# Patient Record
Sex: Female | Born: 1940 | Race: White | Hispanic: No | Marital: Married
Health system: Southern US, Community
[De-identification: ages and names within clinical notes are randomized; demographics above are authoritative.]

## PROBLEM LIST (undated history)

## (undated) DIAGNOSIS — I10 Essential (primary) hypertension: Secondary | ICD-10-CM

---

## 1997-11-11 ENCOUNTER — Other Ambulatory Visit: Admission: RE | Admit: 1997-11-11 | Discharge: 1997-11-11 | Payer: Self-pay | Admitting: Gynecology

## 1998-11-23 ENCOUNTER — Other Ambulatory Visit: Admission: RE | Admit: 1998-11-23 | Discharge: 1998-11-23 | Payer: Self-pay | Admitting: Urology

## 1998-11-25 ENCOUNTER — Encounter: Payer: Self-pay | Admitting: Urology

## 1998-11-25 ENCOUNTER — Encounter: Admission: RE | Admit: 1998-11-25 | Discharge: 1998-11-25 | Payer: Self-pay | Admitting: Urology

## 1998-12-08 ENCOUNTER — Other Ambulatory Visit: Admission: RE | Admit: 1998-12-08 | Discharge: 1998-12-08 | Payer: Self-pay | Admitting: Urology

## 2001-11-25 ENCOUNTER — Other Ambulatory Visit: Admission: RE | Admit: 2001-11-25 | Discharge: 2001-11-25 | Payer: Self-pay | Admitting: Gynecology

## 2003-02-18 ENCOUNTER — Other Ambulatory Visit: Admission: RE | Admit: 2003-02-18 | Discharge: 2003-02-18 | Payer: Self-pay | Admitting: Gynecology

## 2004-03-23 ENCOUNTER — Other Ambulatory Visit: Admission: RE | Admit: 2004-03-23 | Discharge: 2004-03-23 | Payer: Self-pay | Admitting: Family Medicine

## 2004-03-23 ENCOUNTER — Ambulatory Visit: Payer: Self-pay | Admitting: Family Medicine

## 2004-04-04 ENCOUNTER — Ambulatory Visit: Payer: Self-pay | Admitting: Family Medicine

## 2010-10-02 ENCOUNTER — Encounter: Payer: Self-pay | Admitting: Family Medicine

## 2010-10-02 DIAGNOSIS — Z0289 Encounter for other administrative examinations: Secondary | ICD-10-CM

## 2010-12-22 ENCOUNTER — Emergency Department (HOSPITAL_COMMUNITY)
Admission: EM | Admit: 2010-12-22 | Discharge: 2010-12-22 | Disposition: A | Discharge status: Home / Self Care | Payer: Medicare Other | Attending: Emergency Medicine | Admitting: Emergency Medicine

## 2010-12-22 ENCOUNTER — Encounter: Payer: Self-pay | Admitting: *Deleted

## 2010-12-22 ENCOUNTER — Emergency Department (HOSPITAL_COMMUNITY): Payer: Medicare Other

## 2010-12-22 DIAGNOSIS — N390 Urinary tract infection, site not specified: Secondary | ICD-10-CM | POA: Insufficient documentation

## 2010-12-22 DIAGNOSIS — R509 Fever, unspecified: Secondary | ICD-10-CM | POA: Insufficient documentation

## 2010-12-22 DIAGNOSIS — R062 Wheezing: Secondary | ICD-10-CM | POA: Insufficient documentation

## 2010-12-22 LAB — COMPREHENSIVE METABOLIC PANEL
ALT: 44 U/L — ABNORMAL HIGH (ref 0–35)
AST: 53 U/L — ABNORMAL HIGH (ref 0–37)
Albumin: 2.7 g/dL — ABNORMAL LOW (ref 3.5–5.2)
Alkaline Phosphatase: 72 U/L (ref 39–117)
BUN: 12 mg/dL (ref 6–23)
CO2: 24 mEq/L (ref 19–32)
Calcium: 8.4 mg/dL (ref 8.4–10.5)
Chloride: 98 mEq/L (ref 96–112)
Creatinine, Ser: 0.73 mg/dL (ref 0.50–1.10)
GFR calc Af Amer: >90 mL/min (ref >90)
GFR calc non Af Amer: 83 mL/min — ABNORMAL LOW (ref >90)
Glucose, Bld: 106 mg/dL — ABNORMAL HIGH (ref 70–99)
Potassium: 3.5 mEq/L (ref 3.5–5.1)
Sodium: 131 mEq/L — ABNORMAL LOW (ref 135–145)
Total Bilirubin: 0.3 mg/dL (ref 0.3–1.2)
Total Protein: 6.4 g/dL (ref 6.0–8.3)

## 2010-12-22 LAB — DIFFERENTIAL
Basophils Absolute: 0 10*3/uL (ref 0.0–0.1)
Basophils Relative: 0 % (ref 0–1)
Eosinophils Absolute: 0 10*3/uL (ref 0.0–0.7)
Eosinophils Relative: 0 % (ref 0–5)
Lymphocytes Relative: 12 % (ref 12–46)
Lymphs Abs: 0.8 10*3/uL (ref 0.7–4.0)
Monocytes Absolute: 0.7 10*3/uL (ref 0.1–1.0)
Monocytes Relative: 10 % (ref 3–12)
Neutro Abs: 5.1 10*3/uL (ref 1.7–7.7)
Neutrophils Relative %: 77 % (ref 43–77)

## 2010-12-22 LAB — URINALYSIS, ROUTINE W REFLEX MICROSCOPIC
Bilirubin Urine: NEGATIVE
Glucose, UA: NEGATIVE mg/dL
Ketones, ur: NEGATIVE mg/dL
Nitrite: POSITIVE — AB
Protein, ur: NEGATIVE mg/dL
Specific Gravity, Urine: 1.009 (ref 1.005–1.030)
Urobilinogen, UA: 0.2 mg/dL (ref 0.0–1.0)
pH: 6 (ref 5.0–8.0)

## 2010-12-22 LAB — URINE MICROSCOPIC-ADD ON

## 2010-12-22 LAB — CBC
HCT: 34.2 % — ABNORMAL LOW (ref 36.0–46.0)
Hemoglobin: 11.4 g/dL — ABNORMAL LOW (ref 12.0–15.0)
MCH: 29.2 pg (ref 26.0–34.0)
MCHC: 33.3 g/dL (ref 30.0–36.0)
MCV: 87.7 fL (ref 78.0–100.0)
Platelets: 195 10*3/uL (ref 150–400)
RBC: 3.9 MIL/uL (ref 3.87–5.11)
RDW: 13.1 % (ref 11.5–15.5)
WBC: 6.7 10*3/uL (ref 4.0–10.5)

## 2010-12-22 MED ORDER — IBUPROFEN 200 MG PO TABS
400.0000 mg | ORAL_TABLET | Freq: Once | ORAL | Status: AC
  Administered 2010-12-22: 400 mg via ORAL
  Filled 2010-12-22: qty 2

## 2010-12-22 MED ORDER — AMOXICILLIN 500 MG PO CAPS: 500.0000 mg | ORAL_CAPSULE | Freq: Three times a day (TID) | ORAL | Status: AC

## 2010-12-22 MED ORDER — AMOXICILLIN 500 MG PO CAPS
500.0000 mg | ORAL_CAPSULE | Freq: Once | ORAL | Status: AC
  Administered 2010-12-22: 500 mg via ORAL
  Filled 2010-12-22: qty 1

## 2010-12-22 NOTE — ED Provider Notes (Signed)
History     CSN: [id number] Arrival date & time: 12/22/2010  7:02 PM   First MD Initiated Contact with Patient 12/22/10 1930      Chief Complaint  Patient presents with  . Fever    (Consider location/radiation/quality/duration/timing/severity/associated sxs/prior treatment) HPI 70 year old female presents to emergency department complaining of 5 days of intermittent fever to 104.7, body aches and chills. She denies cough, abdominal pain, nausea, vomiting, diarrhea, no urinary symptoms. Patient denies any recent travel or unusual foods or other sources of infection. No rashes noted Lucila Maine has recently been diagnosed with the flu. History reviewed. No pertinent past medical history.  History reviewed. No pertinent past surgical history.  No family history on file.  History  Substance Use Topics  . Smoking status: Never Smoker   . Smokeless tobacco: Not on file  . Alcohol Use: No    OB History    Grav Para Term Preterm Abortions TAB SAB Ect Mult Living                  Review of Systems  All other systems reviewed and are negative.    Allergies  Sulfa antibiotics; Tylenol; and Erythromycin  Home Medications   Current Outpatient Rx  Name Route Sig Dispense Refill  . ASPIRIN 325 MG PO TABS Oral Take 325 mg by mouth daily.      Marland Kitchen CALCIUM CARBONATE 600 MG PO TABS Oral Take 600 mg by mouth 2 (two) times daily with a meal.      . GINKGO BILOBA 100 MG PO CAPS Oral Take 1 capsule by mouth daily.      Marland Kitchen GLUCOSAMINE-CHONDROITIN 500-400 MG PO TABS Oral Take 1 tablet by mouth 2 (two) times daily.      Marland Kitchen GOLDEN SEAL PO Oral Take 1 tablet by mouth daily.      Marland Kitchen ECHINACEA ACZ PO Oral Take 1 tablet by mouth daily.      . OCUVITE-LUTEIN PO CAPS Oral Take 1 capsule by mouth daily.      Marland Kitchen VITAMIN C 500 MG PO TABS Oral Take 500 mg by mouth daily.      . AMOXICILLIN 500 MG PO CAPS Oral Take 1 capsule (500 mg total) by mouth 3 (three) times daily. 21 capsule 0    BP 93/41  Pulse  73  Temp(Src) 98.1 F (36.7 C) (Oral)  Resp 18  Wt 120 lb (54.432 kg)  SpO2 97%  Physical Exam  Nursing note and vitals reviewed. Constitutional: She is oriented to person, place, and time. She appears well-developed and well-nourished.  HENT:  Head: Normocephalic and atraumatic.  Nose: Nose normal.  Mouth/Throat: Oropharynx is clear and moist.  Eyes: Conjunctivae and EOM are normal. Pupils are equal, round, and reactive to light.  Neck: Normal range of motion. Neck supple. No JVD present. No tracheal deviation present. No thyromegaly present.  Cardiovascular: Normal rate, regular rhythm, normal heart sounds and intact distal pulses.  Exam reveals no gallop and no friction rub.   No murmur heard. Pulmonary/Chest: Effort normal and breath sounds normal. No stridor. No respiratory distress. She has no wheezes. She has no rales. She exhibits no tenderness.  Abdominal: Soft. Bowel sounds are normal. She exhibits no distension and no mass. There is no tenderness. There is no rebound and no guarding.  Musculoskeletal: Normal range of motion. She exhibits no edema and no tenderness.  Lymphadenopathy:    She has no cervical adenopathy.  Neurological: She is oriented to person, place, and  time. She exhibits normal muscle tone. Coordination normal.  Skin: Skin is dry. No rash noted. No erythema. No pallor.  Psychiatric: She has a normal mood and affect. Her behavior is normal. Judgment and thought content normal.    ED Course  Procedures (including critical care time)  Labs Reviewed  URINALYSIS, ROUTINE W REFLEX MICROSCOPIC - Abnormal; Notable for the following:    APPearance CLOUDY (*)    Hgb urine dipstick LARGE (*)    Nitrite POSITIVE (*)    Leukocytes, UA MODERATE (*)    All other components within normal limits  CBC - Abnormal; Notable for the following:    Hemoglobin 11.4 (*)    HCT 34.2 (*)    All other components within normal limits  COMPREHENSIVE METABOLIC PANEL - Abnormal;  Notable for the following:    Sodium 131 (*)    Glucose, Bld 106 (*)    Albumin 2.7 (*)    AST 53 (*)    ALT 44 (*)    GFR calc non Af Amer 83 (*)    All other components within normal limits  URINE MICROSCOPIC-ADD ON - Abnormal; Notable for the following:    Bacteria, UA FEW (*)    All other components within normal limits  DIFFERENTIAL  URINE CULTURE  CULTURE, BLOOD (ROUTINE X 2)  CULTURE, BLOOD (ROUTINE X 2)   Dg Chest 2 View  12/22/2010  *RADIOLOGY REPORT*  Clinical Data: Fever and wheezing  CHEST - 2 VIEW  Comparison: None.  Findings: Heart size is normal.  Negative for heart failure.  Lungs are clear without infiltrate or effusion.  Mild atelectasis or scarring in the lingula or right middle lobe on the lateral view.  IMPRESSION: No active cardiopulmonary disease.  Original Report Authenticated By: Camelia Phenes, M.D.     1. Fever   2. Urinary tract infection       MDM  70 year old female with 5 days of fever possible urinary tract infection as source of infection, although viral syndrome and/or influenza is still in the differential. Patient is well outside the window for Tamiflu. Will treat with amoxicillin for urinary tract infection given patient's multiple allergies and refusal to try any new antibiotics. Urine sent for culture        Olivia Mackie, MD 12/22/10 2200

## 2010-12-22 NOTE — ED Notes (Signed)
[id number]<AV> Expected date:12/22/10<BR> Expected time: 6:43 PM<BR> Means of arrival:Ambulance<BR> Comments:<BR> EMS 261 GC, 70 yof fever

## 2010-12-22 NOTE — ED Notes (Signed)
Pt c/o only of fever x 5 days. Pt has been infrequently taking medication to control fever. Pt states she has been maintaining good hydration and using ice packs to help reduce fever. Pt states she takes a lot of herbal remedies that she does not have a list of nor does she know exactly what herbs are contained within many of them. Pt is in no acute distress at this time. Denies n/v/d, denies pain.

## 2010-12-22 NOTE — ED Notes (Signed)
Pt from home, reports fever, highest 104.5. Denies cough, n/v, any other symptoms. Pt is HOH.

## 2010-12-25 LAB — URINE CULTURE
Colony Count: >=100000
Culture  Setup Time: 201212150156
Special Requests: NORMAL

## 2010-12-26 NOTE — ED Notes (Signed)
+   Urine Patient treated with Amoxicillin-sensitive to same-chart appended per protocol MD. 

## 2010-12-29 LAB — CULTURE, BLOOD (ROUTINE X 2)
Culture  Setup Time: 201212150250
Culture  Setup Time: 201212150250
Culture: NO GROWTH
Culture: NO GROWTH

## 2016-04-10 ENCOUNTER — Observation Stay (HOSPITAL_COMMUNITY)
Admission: EM | Admit: 2016-04-10 | Discharge: 2016-04-11 | Disposition: A | Discharge status: Home / Self Care | Payer: Medicare Other | Attending: Internal Medicine | Admitting: Internal Medicine

## 2016-04-10 ENCOUNTER — Encounter (HOSPITAL_COMMUNITY): Payer: Self-pay

## 2016-04-10 ENCOUNTER — Emergency Department (HOSPITAL_COMMUNITY): Payer: Medicare Other

## 2016-04-10 ENCOUNTER — Observation Stay (HOSPITAL_COMMUNITY): Payer: Medicare Other

## 2016-04-10 DIAGNOSIS — I160 Hypertensive urgency: Secondary | ICD-10-CM | POA: Diagnosis not present

## 2016-04-10 DIAGNOSIS — I6523 Occlusion and stenosis of bilateral carotid arteries: Secondary | ICD-10-CM | POA: Insufficient documentation

## 2016-04-10 DIAGNOSIS — I071 Rheumatic tricuspid insufficiency: Secondary | ICD-10-CM | POA: Insufficient documentation

## 2016-04-10 DIAGNOSIS — I161 Hypertensive emergency: Secondary | ICD-10-CM | POA: Diagnosis present

## 2016-04-10 DIAGNOSIS — G459 Transient cerebral ischemic attack, unspecified: Secondary | ICD-10-CM | POA: Diagnosis present

## 2016-04-10 DIAGNOSIS — R200 Anesthesia of skin: Secondary | ICD-10-CM | POA: Diagnosis not present

## 2016-04-10 DIAGNOSIS — G458 Other transient cerebral ischemic attacks and related syndromes: Secondary | ICD-10-CM | POA: Diagnosis not present

## 2016-04-10 DIAGNOSIS — Z79899 Other long term (current) drug therapy: Secondary | ICD-10-CM | POA: Insufficient documentation

## 2016-04-10 DIAGNOSIS — Z7982 Long term (current) use of aspirin: Secondary | ICD-10-CM | POA: Insufficient documentation

## 2016-04-10 HISTORY — DX: Anesthesia of skin: R20.0

## 2016-04-10 LAB — BASIC METABOLIC PANEL
Anion gap: 11 (ref 5–15)
BUN: 16 mg/dL (ref 6–20)
CO2: 25 mmol/L (ref 22–32)
Calcium: 9.9 mg/dL (ref 8.9–10.3)
Chloride: 101 mmol/L (ref 101–111)
Creatinine, Ser: 0.68 mg/dL (ref 0.44–1.00)
GFR calc Af Amer: >60 mL/min (ref >60)
GFR calc non Af Amer: >60 mL/min (ref >60)
Glucose, Bld: 98 mg/dL (ref 65–99)
Potassium: 4 mmol/L (ref 3.5–5.1)
Sodium: 137 mmol/L (ref 135–145)

## 2016-04-10 LAB — CBC WITH DIFFERENTIAL/PLATELET
Basophils Absolute: 0 10*3/uL (ref 0.0–0.1)
Basophils Relative: 0 %
Eosinophils Absolute: 0.1 10*3/uL (ref 0.0–0.7)
Eosinophils Relative: 1 %
HCT: 43.3 % (ref 36.0–46.0)
Hemoglobin: 14.2 g/dL (ref 12.0–15.0)
Lymphocytes Relative: 38 %
Lymphs Abs: 2.4 10*3/uL (ref 0.7–4.0)
MCH: 29.8 pg (ref 26.0–34.0)
MCHC: 32.8 g/dL (ref 30.0–36.0)
MCV: 90.8 fL (ref 78.0–100.0)
Monocytes Absolute: 0.5 10*3/uL (ref 0.1–1.0)
Monocytes Relative: 8 %
Neutro Abs: 3.2 10*3/uL (ref 1.7–7.7)
Neutrophils Relative %: 53 %
Platelets: 232 10*3/uL (ref 150–400)
RBC: 4.77 MIL/uL (ref 3.87–5.11)
RDW: 13.4 % (ref 11.5–15.5)
WBC: 6.2 10*3/uL (ref 4.0–10.5)

## 2016-04-10 LAB — I-STAT TROPONIN, ED: Troponin i, poc: 0.05 ng/mL (ref 0.00–0.08)

## 2016-04-10 LAB — TSH: TSH: 3.122 u[IU]/mL (ref 0.350–4.500)

## 2016-04-10 LAB — MAGNESIUM: Magnesium: 2 mg/dL (ref 1.7–2.4)

## 2016-04-10 MED ORDER — LORAZEPAM 2 MG/ML IJ SOLN
0.5000 mg | Freq: Once | INTRAMUSCULAR | Status: AC
  Administered 2016-04-10: 0.5 mg via INTRAVENOUS
  Filled 2016-04-10: qty 1

## 2016-04-10 MED ORDER — ASPIRIN 325 MG PO TABS
325.0000 mg | ORAL_TABLET | Freq: Every day | ORAL | Status: DC
  Administered 2016-04-11: 325 mg via ORAL
  Filled 2016-04-10: qty 1

## 2016-04-10 MED ORDER — SENNOSIDES-DOCUSATE SODIUM 8.6-50 MG PO TABS: 1.0000 | ORAL_TABLET | Freq: Every evening | ORAL | Status: DC | PRN

## 2016-04-10 MED ORDER — ENOXAPARIN SODIUM 40 MG/0.4ML ~~LOC~~ SOLN
40.0000 mg | SUBCUTANEOUS | Status: DC
  Administered 2016-04-10: 40 mg via SUBCUTANEOUS
  Filled 2016-04-10: qty 0.4

## 2016-04-10 MED ORDER — STROKE: EARLY STAGES OF RECOVERY BOOK
Freq: Once | Status: AC
  Administered 2016-04-10: 23:00:00
  Filled 2016-04-10: qty 1

## 2016-04-10 MED ORDER — LABETALOL HCL 5 MG/ML IV SOLN: 5.0000 mg | INTRAVENOUS | Status: DC | PRN

## 2016-04-10 MED ORDER — ASPIRIN 81 MG PO CHEW
324.0000 mg | CHEWABLE_TABLET | Freq: Once | ORAL | Status: AC
  Administered 2016-04-10: 324 mg via ORAL
  Filled 2016-04-10: qty 4

## 2016-04-10 MED ORDER — SODIUM CHLORIDE 0.9 % IV SOLN
INTRAVENOUS | Status: DC
  Administered 2016-04-10: 23:00:00 via INTRAVENOUS

## 2016-04-10 MED ORDER — ASPIRIN 300 MG RE SUPP: 300.0000 mg | Freq: Every day | RECTAL | Status: DC

## 2016-04-10 NOTE — ED Notes (Signed)
Called floor 3x for report. No answer.

## 2016-04-10 NOTE — ED Notes (Signed)
Pt taken to CT by Reuel Boom, Transporter

## 2016-04-10 NOTE — ED Triage Notes (Signed)
Patient complains of intermittent numbness in right arm today that only lasted a few minutes and has resolved. 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 On arrival no neuro deficits, no pain, alert and oriented. Took 2 baby asa prior to arrival

## 2016-04-10 NOTE — H&P (Signed)
History and Physical    Rebecca Hurley JXB:[id number] DOB: April 11, 1940 DOA: 04/10/2016  PCP: Default, Provider, MD   Patient coming from: Home  Chief Complaint: Right-sided numbness   HPI: Rebecca Hurley is a 76 y.o. female who denies any significant past medical history, now presenting to the emergency department for numbness involving the right arm, right leg, and right lower face. 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 She had otherwise been in her usual state of health with no recent fevers or chills, no chest pain or palpitations, and no recent headache, change in vision or hearing, or focal weakness. She describes the insidious development of numbness involving the right arm earlier today, then progressing to involve the right leg as well. These symptoms seem to wax and wane over the course of the day and she later developed numbness into her right neck and right lower face. By this time, the patient was becoming quite concerned with the symptoms and sought evaluation in the ED. She took 2 baby aspirins prior to coming in and reports that the symptoms seemed to resolve following that. She had never experienced similar symptoms previously. There's been no recent fall or trauma.  ED Course: Upon arrival to the ED, patient is found to be afebrile, saturating well on room air, hypertensive to 199/89, and with vitals otherwise stable. EKG features a normal sinus rhythm and noncontrast head CT is negative for hemorrhage, but notable for small hypodensity in the left frontal region which may represent chronic microvascular changes, but could also possibly represent a small acute infarct. Chemistry panel is unremarkable, CBC is within the normal limits, TSH is normal, and troponin is also in the normal range. Patient was treated with 3 and 24 mg of aspirin at 0.5 mg IV Ativan. Neuro was consulted by the ED  physician and advised treating the patient as if the CT finding does represent a small acute infarct in until it can be further characterized. She has remained hypertensive, but otherwise stable in the ED and will be observed on telemetry unit for ongoing evaluation and management of transient right-sided numbness with hypertensive urgency and noncontrast head CT finding of uncertain significance.  Review of Systems:  All other systems reviewed and apart from HPI, are negative.  History reviewed. No pertinent past medical history.  History reviewed. No pertinent surgical history.   reports that she has never smoked. She does not have any smokeless tobacco history on file. She reports that she does not drink alcohol. Her drug history is not on file.  Allergies  Allergen Reactions  . Sulfa Antibiotics Other (See Comments)    Severe reaction-per family  . Erythromycin Other (See Comments)    Potential ear troubles/ deafness  . Tylenol [Acetaminophen] Other (See Comments)    Altered mental status and mental changes    History reviewed. No pertinent family history.   Prior to Admission medications   Medication Sig Start Date End Date Taking? Authorizing Provider  aspirin 325 MG tablet Take 650 mg by mouth every 6 (six) hours as needed for moderate pain.    Yes Historical Provider, MD  CALCIUM-VITAMIN D PO Take 1 tablet by mouth 2 (two) times daily.   Yes Historical Provider, MD  CRANBERRY JUICE EXTRACT PO Take 1 tablet by mouth 3 (three) times a week.   Yes Historical Provider, MD  Flaxseed, Linseed, (FLAX SEED OIL PO) Take 5  mLs by mouth daily.   Yes Historical Provider, MD  Ginkgo Biloba (GNP GINGKO BILOBA EXTRACT PO) Take 40 mg by mouth daily.   Yes Historical Provider, MD  glucosamine-chondroitin 500-400 MG tablet Take 1 tablet by mouth 2 (two) times daily.     Yes Historical Provider, MD  IODINE, KELP, PO Take 1 tablet by mouth daily.   Yes Historical Provider, MD  LUTEIN PO Take 120  mg by mouth daily.   Yes Historical Provider, MD  vitamin C (ASCORBIC ACID) 500 MG tablet Take 500 mg by mouth 2 (two) times daily.    Yes Historical Provider, MD    Physical Exam: Vitals:   04/10/16 1900 04/10/16 1930 04/10/16 2000 04/10/16 2030  BP: (!) 184/72 (!) 187/76 (!) 182/98 (!) 174/80  Pulse: 67 64 66 62  Resp: (!) 22  Temp:      TempSrc:      SpO2: 100% 100% 96% 98%      Constitutional: NAD, calm, comfortable Eyes: PERTLA, lids and conjunctivae normal ENMT: Mucous membranes are moist. Posterior pharynx clear of any exudate or lesions.   Neck: normal, supple, no masses, no thyromegaly Respiratory: clear to auscultation bilaterally, no wheezing, no crackles. Normal respiratory effort.    Cardiovascular: S1 & S2 heard, regular rate and rhythm. No extremity edema. No significant JVD. Abdomen: No distension, no tenderness, no masses palpated. Bowel sounds normal.  Musculoskeletal: no clubbing / cyanosis. No joint deformity upper and lower extremities.    Skin: no significant rashes, lesions, ulcers. Warm, dry, well-perfused. Neurologic: CN 2-12 grossly intact. Sensation intact, DTR normal. Strength 5/5 in all 4 limbs.  Psychiatric: Alert and oriented x 3. Normal mood and affect.     Labs on Admission: I have personally reviewed following labs and imaging studies  CBC:  Recent Labs Lab 04/10/16 1258  WBC 6.2  NEUTROABS 3.2  HGB 14.2  HCT 43.3  MCV 90.8  PLT 232   Basic Metabolic Panel:  Recent Labs Lab 04/10/16 1258 04/10/16 1701  NA 137  --   K 4.0  --   CL 101  --   CO2 25  --   GLUCOSE 98  --   BUN 16  --   CREATININE 0.68  --   CALCIUM 9.9  --   MG  --  2.0   GFR: CrCl cannot be calculated (Unknown ideal weight.). Liver Function Tests: No results for input(s): AST, ALT, ALKPHOS, BILITOT, PROT, ALBUMIN in the last 168 hours. No results for input(s): LIPASE, AMYLASE in the last 168 hours. No results for input(s): AMMONIA in the last  168 hours. Coagulation Profile: No results for input(s): INR, PROTIME in the last 168 hours. Cardiac Enzymes: No results for input(s): CKTOTAL, CKMB, CKMBINDEX, TROPONINI in the last 168 hours. BNP (last 3 results) No results for input(s): PROBNP in the last 8760 hours. HbA1C: No results for input(s): HGBA1C in the last 72 hours. CBG: No results for input(s): GLUCAP in the last 168 hours. Lipid Profile: No results for input(s): CHOL, HDL, LDLCALC, TRIG, CHOLHDL, LDLDIRECT in the last 72 hours. Thyroid Function Tests:  Recent Labs  04/10/16 1701  TSH 3.122   Anemia Panel: No results for input(s): VITAMINB12, FOLATE, FERRITIN, TIBC, IRON, RETICCTPCT in the last 72 hours. Urine analysis:    Component Value Date/Time   COLORURINE YELLOW 12/22/2010 2018   APPEARANCEUR CLOUDY (A) 12/22/2010 2018   LABSPEC 1.009 12/22/2010 2018   PHURINE 6.0 12/22/2010 2018  GLUCOSEU NEGATIVE 12/22/2010 2018   HGBUR LARGE (A) 12/22/2010 2018   BILIRUBINUR NEGATIVE 12/22/2010 2018   KETONESUR NEGATIVE 12/22/2010 2018   PROTEINUR NEGATIVE 12/22/2010 2018   UROBILINOGEN 0.2 12/22/2010 2018   NITRITE POSITIVE (A) 12/22/2010 2018   LEUKOCYTESUR MODERATE (A) 12/22/2010 2018   Sepsis Labs: (procalcitonin:4,lacticidven:4) )No results found for this or any previous visit (from the past 240 hour(s)).   Radiological Exams on Admission: Ct Head Wo Contrast  Result Date: 04/10/2016 CLINICAL DATA:  76 year old female with right-sided arm and leg numbness. Episode of elevated blood pressure. Initial encounter. EXAM: CT HEAD WITHOUT CONTRAST TECHNIQUE: Contiguous axial images were obtained from the base of the skull through the vertex without intravenous contrast. COMPARISON:  None. FINDINGS: Brain: No intracranial hemorrhage. Small hypodensity left frontal region may represent result of chronic microvascular changes. Small acute infarct difficult to completely exclude. No CT evidence of large acute  infarct. Mild to moderate chronic microvascular changes otherwise noted. Mild atrophy typical of age without hydrocephalus. No intracranial mass lesion noted on this unenhanced exam. Vascular: No hyperdense vessel. Skull: Negative Sinuses/Orbits: No acute orbital abnormality. Visualized paranasal sinuses are clear. Other: Negative. IMPRESSION: No intracranial hemorrhage. Small hypodensity left frontal region may represent result of chronic microvascular changes. Small acute infarct difficult to completely exclude. No CT evidence of large acute infarct. Mild to moderate chronic microvascular changes otherwise noted. Electronically Signed   By: Lacy Duverney M.D.   On: 04/10/2016 17:43    EKG: Independently reviewed. Normal sinus rhythm.   Assessment/Plan  1. Right-sided numbness, resolved  - Pt presents with transient right-sided numbness involving arm, leg, and lower right face; sxs resolved PTA  - Head CT notable for a small hypodensity in left frontal region, possibly representing chronic microvascular change, but with small acute infarct not excluded  - There are no focal neurologic abnormalities identified on admission  - Neurology is consulting and much appreciated  - Monitor on telemetry with frequent neuro checks, PT/OT consults; she has passed swallow eval  - Obtain MRI brain, MRA head, carotid dopplers, echo, fasting lipids, and A1c  - Continue ppx ASA, check LFT's and consider statin   2. Hypertensive urgency  - BP as high as 199/89 in ED; pt asymptomatic  - Denies hx of HTN and not on any medications  - As acute ischemic CVA is being ruled-out currently, will permit HTN to 210/110 pending MRI and treat with labetalol IVP's for pressures exceeding that     DVT prophylaxis: sq Lovenox  Code Status: Full  Family Communication: Friend updated at bedside at patient's request Disposition Plan: Observe on telemetry Consults called: Neurology Admission status: Observation    Briscoe Deutscher, MD [facility] Pager [phone]  If 7PM-7AM, please contact night-coverage [email or web address] Password [email or web address]  04/10/2016, 9:10 PM

## 2016-04-10 NOTE — ED Provider Notes (Signed)
[facility]-EMERGENCY DEPT Provider Note   CSN: [id number] Arrival date & time: 04/10/16  1239     History   Chief Complaint No chief complaint on file.   HPI Rebecca Hurley is a 76 y.o. female.  HPI   76 yo F with no significant PMhx here with hypertension and numbness. Pt states that she has been under increased stress over the last several days, due to stress at her church. Over the last day, she has had multiple episodes of transient right face, arm, and leg numbness. This occurs in "waves" and involves only parts of her arm, leg, or face - not all at the same time. She has a mild HA associated with this and has noticed that her BP has significantly increased. She does not normally take antiHTN. Denies any CP. No other numbness or weakness. No vision changes, dysarthria, or dysphagia. No recent trauma. She does not take blood thinners.   History reviewed. No pertinent past medical history.  Patient Active Problem List   Diagnosis Date Noted  . Hypertensive urgency 04/10/2016  . Numbness on right side 04/10/2016  . TIA (transient ischemic attack) 04/10/2016    History reviewed. No pertinent surgical history.  OB History    No data available       Home Medications    Prior to Admission medications   Medication Sig Start Date End Date Taking? Authorizing Provider  aspirin 325 MG tablet Take 650 mg by mouth every 6 (six) hours as needed for moderate pain.    Yes Historical Provider, MD  CALCIUM-VITAMIN D PO Take 1 tablet by mouth 2 (two) times daily.   Yes Historical Provider, MD  CRANBERRY JUICE EXTRACT PO Take 1 tablet by mouth 3 (three) times a week.   Yes Historical Provider, MD  Flaxseed, Linseed, (FLAX SEED OIL PO) Take 5 mLs by mouth daily.   Yes Historical Provider, MD  Ginkgo Biloba (GNP GINGKO BILOBA EXTRACT PO) Take 40 mg by mouth daily.   Yes Historical Provider, MD  glucosamine-chondroitin 500-400 MG tablet Take 1 tablet by mouth 2 (two) times daily.     Yes  Historical Provider, MD  IODINE, KELP, PO Take 1 tablet by mouth daily.   Yes Historical Provider, MD  LUTEIN PO Take 120 mg by mouth daily.   Yes Historical Provider, MD  vitamin C (ASCORBIC ACID) 500 MG tablet Take 500 mg by mouth 2 (two) times daily.    Yes Historical Provider, MD    Family History History reviewed. No pertinent family history.  Social History Social History  Substance Use Topics  . Smoking status: Never Smoker  . Smokeless tobacco: Never Used  . Alcohol use No     Allergies   Sulfa antibiotics; Erythromycin; and Tylenol [acetaminophen]   Review of Systems Review of Systems  Constitutional: Positive for fatigue. Negative for chills and fever.  HENT: Negative for congestion, rhinorrhea and sore throat.   Eyes: Negative for visual disturbance.  Respiratory: Negative for cough, shortness of breath and wheezing.   Cardiovascular: Negative for chest pain and leg swelling.  Gastrointestinal: Negative for abdominal pain, diarrhea, nausea and vomiting.  Genitourinary: Negative for dysuria, flank pain, vaginal bleeding and vaginal discharge.  Musculoskeletal: Negative for neck pain.  Skin: Negative for rash.  Allergic/Immunologic: Negative for immunocompromised state.  Neurological: Positive for numbness and headaches. Negative for syncope.  Hematological: Does not bruise/bleed easily.  Psychiatric/Behavioral: Positive for sleep disturbance.  All other systems reviewed and are negative.  Physical Exam Updated Vital Signs BP (!) 135/52 (BP Location: Left Arm)   Pulse (!) 58   Temp 98.2 F (36.8 C) (Oral)   Resp 10   Ht  (1.549 m)   Wt 120 lb 5.9 oz (54.6 kg)   SpO2 100%   BMI 22.74 kg/m   Physical Exam  Constitutional: She is oriented to person, place, and time. She appears well-developed and well-nourished. No distress.  HENT:  Head: Normocephalic and atraumatic.  Eyes: Conjunctivae are normal.  Neck: Neck supple.  Cardiovascular: Normal  rate, regular rhythm and normal heart sounds.  Exam reveals no friction rub.   No murmur heard. Pulmonary/Chest: Effort normal and breath sounds normal. No respiratory distress. She has no wheezes. She has no rales.  Abdominal: She exhibits no distension.  Musculoskeletal: She exhibits no edema.  Neurological: She is alert and oriented to person, place, and time. She exhibits normal muscle tone.  Skin: Skin is warm. Capillary refill takes less than 2 seconds.  Psychiatric: She has a normal mood and affect.  Nursing note and vitals reviewed.   Neurological Exam:  Mental Status: Alert and oriented to person, place, and time. Attention and concentration normal. Speech clear. Recent memory is intact. Cranial Nerves: Visual fields grossly intact. EOMI and PERRLA. No nystagmus noted. Facial sensation intact at forehead, maxillary cheek, and chin/mandible bilaterally. No facial asymmetry or weakness. Hearing grossly normal. Uvula is midline, and palate elevates symmetrically. Normal SCM and trapezius strength. Tongue midline without fasciculations. Motor: Muscle strength 5/5 in proximal and distal UE and LE bilaterally. No pronator drift. Muscle tone normal. Reflexes: 2+ and symmetrical in all four extremities.  Sensation: Intact to light touch in upper and lower extremities distally bilaterally.  Gait: Normal without ataxia. Coordination: Normal FTN bilaterally.    ED Treatments / Results  Labs (all labs ordered are listed, but only abnormal results are displayed) Labs Reviewed  CBC WITH DIFFERENTIAL/PLATELET  BASIC METABOLIC PANEL  TSH  MAGNESIUM  HEMOGLOBIN A1C  LIPID PANEL  HEPATIC FUNCTION PANEL  CK  I-STAT TROPOININ, ED    EKG  EKG Interpretation  Date/Time:  Tuesday April 10 2016 17:06:04 EDT Ventricular Rate:  65 PR Interval:    QRS Duration: 109 QT Interval:  415 QTC Calculation: 432 R Axis:   81 Text Interpretation:  Sinus rhythm Borderline right axis deviation  Otherwise normal EKG No old tracing to compare Confirmed by Dally Oshel MD, Shellye Zandi [phone]) on 04/11/2016 1:10:06 AM       Radiology Ct Head Wo Contrast  Result Date: 04/10/2016 CLINICAL DATA:  76 year old female with right-sided arm and leg numbness. Episode of elevated blood pressure. Initial encounter. EXAM: CT HEAD WITHOUT CONTRAST TECHNIQUE: Contiguous axial images were obtained from the base of the skull through the vertex without intravenous contrast. COMPARISON:  None. FINDINGS: Brain: No intracranial hemorrhage. Small hypodensity left frontal region may represent result of chronic microvascular changes. Small acute infarct difficult to completely exclude. No CT evidence of large acute infarct. Mild to moderate chronic microvascular changes otherwise noted. Mild atrophy typical of age without hydrocephalus. No intracranial mass lesion noted on this unenhanced exam. Vascular: No hyperdense vessel. Skull: Negative Sinuses/Orbits: No acute orbital abnormality. Visualized paranasal sinuses are clear. Other: Negative. IMPRESSION: No intracranial hemorrhage. Small hypodensity left frontal region may represent result of chronic microvascular changes. Small acute infarct difficult to completely exclude. No CT evidence of large acute infarct. Mild to moderate chronic microvascular changes otherwise noted. Electronically Signed   By: Viviann Spare  Constance Goltz M.D.   On: 04/10/2016 17:43   Mr Brain Wo Contrast  Result Date: 04/11/2016 CLINICAL DATA:  76 y/o F; numbness of right arm, leg, and right lower face. EXAM: MRI HEAD WITHOUT CONTRAST MRA HEAD WITHOUT CONTRAST TECHNIQUE: Multiplanar, multiecho pulse sequences of the brain and surrounding structures were obtained without intravenous contrast. Angiographic images of the head were obtained using MRA technique without contrast. COMPARISON:  04/10/2016 CT of the head. FINDINGS: MRI HEAD FINDINGS Brain: No acute infarction, hemorrhage, hydrocephalus, extra-axial collection or  mass lesion. Nonspecific foci of T2 FLAIR hyperintense signal abnormality are present in subcortical and periventricular white matter compatible with mild chronic microvascular ischemic changes. FLAIR signal abnormality is inclusive of the lucent lesion seen on CT. Mild brain parenchymal volume loss. Vascular: As below. Skull and upper cervical spine: Normal marrow signal. Sinuses/Orbits: Negative. Other: None. MRA HEAD FINDINGS Internal carotid arteries:  Patent. Anterior cerebral arteries:  Patent. Middle cerebral arteries: Patent. Anterior communicating artery: Patent diminutive vessel. Posterior communicating arteries: Patent right. No left identified, likely hypoplastic or absent. Posterior cerebral arteries:  Patent. Basilar artery:  Patent. Vertebral arteries:  Patent. No evidence of high-grade stenosis, large vessel occlusion, or aneurysm. IMPRESSION: 1. No acute intracranial abnormality. 2. Mild chronic microvascular ischemic changes of white matter inclusive of the hypodense focus seen on CT. 3. Mild brain parenchymal volume loss. 4. Unremarkable MRA of the head. No evidence of high-grade stenosis, large vessel occlusion, or aneurysm. Electronically Signed   By: Mitzi Hansen M.D.   On: 04/11/2016 00:58   Mr Maxine Glenn Head/brain OZ Cm  Result Date: 04/11/2016 CLINICAL DATA:  76 y/o F; numbness of right arm, leg, and right lower face. EXAM: MRI HEAD WITHOUT CONTRAST MRA HEAD WITHOUT CONTRAST TECHNIQUE: Multiplanar, multiecho pulse sequences of the brain and surrounding structures were obtained without intravenous contrast. Angiographic images of the head were obtained using MRA technique without contrast. COMPARISON:  04/10/2016 CT of the head. FINDINGS: MRI HEAD FINDINGS Brain: No acute infarction, hemorrhage, hydrocephalus, extra-axial collection or mass lesion. Nonspecific foci of T2 FLAIR hyperintense signal abnormality are present in subcortical and periventricular white matter compatible with  mild chronic microvascular ischemic changes. FLAIR signal abnormality is inclusive of the lucent lesion seen on CT. Mild brain parenchymal volume loss. Vascular: As below. Skull and upper cervical spine: Normal marrow signal. Sinuses/Orbits: Negative. Other: None. MRA HEAD FINDINGS Internal carotid arteries:  Patent. Anterior cerebral arteries:  Patent. Middle cerebral arteries: Patent. Anterior communicating artery: Patent diminutive vessel. Posterior communicating arteries: Patent right. No left identified, likely hypoplastic or absent. Posterior cerebral arteries:  Patent. Basilar artery:  Patent. Vertebral arteries:  Patent. No evidence of high-grade stenosis, large vessel occlusion, or aneurysm. IMPRESSION: 1. No acute intracranial abnormality. 2. Mild chronic microvascular ischemic changes of white matter inclusive of the hypodense focus seen on CT. 3. Mild brain parenchymal volume loss. 4. Unremarkable MRA of the head. No evidence of high-grade stenosis, large vessel occlusion, or aneurysm. Electronically Signed   By: Mitzi Hansen M.D.   On: 04/11/2016 00:58    Procedures Procedures (including critical care time)  Medications Ordered in ED Medications  0.9 %  sodium chloride infusion ( Intravenous New Bag/Given 04/10/16 2257)  senna-docusate (Senokot-S) tablet 1 tablet (not administered)  enoxaparin (LOVENOX) injection 40 mg (40 mg Subcutaneous Given 04/10/16 2257)  aspirin suppository 300 mg (not administered)    Or  aspirin tablet 325 mg (not administered)  labetalol (NORMODYNE,TRANDATE) injection 5 mg (not administered)  LORazepam (ATIVAN) injection  0.5 mg (0.5 mg Intravenous Given 04/10/16 1713)  aspirin chewable tablet 324 mg (324 mg Oral Given 04/10/16 1835)   stroke: mapping our early stages of recovery book ( Does not apply Given 04/10/16 2254)     Initial Impression / Assessment and Plan / ED Course  I have reviewed the triage vital signs and the nursing notes.  Pertinent  labs & imaging results that were available during my care of the patient were reviewed by me and considered in my medical decision making (see chart for details).    76 yo F with PMhx as above here with transient left sided numbness and increasing BP. On arrival, pt is hypertensive but o/w HDS. She has no ongoing neurological deficits. While I suspect this may be 2/2 her HTN, possible anxiety from recent stressors, must also consider TIA. Her CT head does show a small area of vascular disease/possible old CVA as well. D/w Neuro - pt given ASA. No indication for tPA or intervention at this time and sx resolved. Will admit for TIA/CVA work-up. Will allow permissive HTN at this time.  Final Clinical Impressions(s) / ED Diagnoses   Final diagnoses:  Numbness  Other specified transient cerebral ischemias    New Prescriptions Current Discharge Medication List       Shaune Pollack, MD 04/11/16 0111

## 2016-04-10 NOTE — Progress Notes (Signed)
Pt admitted from ED with stroke like symptoms, alert and oriented, denies any pain, pt settled in bed with husband and call light  at bedside, pt HOH, close caption placed on TV, tele monitor placed and verified on pt, safety concern addressed,was however reassured and will continue to monitor, v/s stable. Rebecca Hurley, [name]

## 2016-04-11 ENCOUNTER — Observation Stay (HOSPITAL_BASED_OUTPATIENT_CLINIC_OR_DEPARTMENT_OTHER): Payer: Medicare Other

## 2016-04-11 ENCOUNTER — Encounter (HOSPITAL_COMMUNITY): Payer: Self-pay | Admitting: *Deleted

## 2016-04-11 DIAGNOSIS — G459 Transient cerebral ischemic attack, unspecified: Secondary | ICD-10-CM | POA: Diagnosis not present

## 2016-04-11 DIAGNOSIS — I160 Hypertensive urgency: Secondary | ICD-10-CM | POA: Diagnosis not present

## 2016-04-11 DIAGNOSIS — R200 Anesthesia of skin: Secondary | ICD-10-CM

## 2016-04-11 DIAGNOSIS — G458 Other transient cerebral ischemic attacks and related syndromes: Secondary | ICD-10-CM | POA: Diagnosis not present

## 2016-04-11 LAB — HEPATIC FUNCTION PANEL
ALT: 11 U/L — ABNORMAL LOW (ref 14–54)
AST: 23 U/L (ref 15–41)
Albumin: 3.1 g/dL — ABNORMAL LOW (ref 3.5–5.0)
Alkaline Phosphatase: 51 U/L (ref 38–126)
Bilirubin, Direct: 0.1 mg/dL (ref 0.1–0.5)
Indirect Bilirubin: 0.7 mg/dL (ref 0.3–0.9)
Total Bilirubin: 0.8 mg/dL (ref 0.3–1.2)
Total Protein: 5.5 g/dL — ABNORMAL LOW (ref 6.5–8.1)

## 2016-04-11 LAB — LIPID PANEL
Cholesterol: 214 mg/dL — ABNORMAL HIGH (ref 0–200)
HDL: 86 mg/dL (ref >40)
LDL Cholesterol: 117 mg/dL — ABNORMAL HIGH (ref 0–99)
Total CHOL/HDL Ratio: 2.5 RATIO
Triglycerides: 56 mg/dL (ref <150)
VLDL: 11 mg/dL (ref 0–40)

## 2016-04-11 LAB — MAGNESIUM: Magnesium: 1.9 mg/dL (ref 1.7–2.4)

## 2016-04-11 LAB — ECHOCARDIOGRAM COMPLETE
Height: 61 in
Weight: 1925.94 oz

## 2016-04-11 LAB — CK: Total CK: 89 U/L (ref 38–234)

## 2016-04-11 MED ORDER — ASPIRIN EC 81 MG PO TBEC: 81.0000 mg | DELAYED_RELEASE_TABLET | Freq: Every day | ORAL | Status: DC

## 2016-04-11 MED ORDER — ACETAMINOPHEN 325 MG PO TABS: 650.0000 mg | ORAL_TABLET | ORAL | Status: DC | PRN

## 2016-04-11 MED ORDER — AMLODIPINE BESYLATE 2.5 MG PO TABS: 2.5000 mg | ORAL_TABLET | Freq: Every day | ORAL | 0 refills | Status: DC

## 2016-04-11 MED ORDER — AMLODIPINE BESYLATE 2.5 MG PO TABS
2.5000 mg | ORAL_TABLET | Freq: Every day | ORAL | Status: DC
  Administered 2016-04-11: 2.5 mg via ORAL
  Filled 2016-04-11: qty 1

## 2016-04-11 MED ORDER — AMLODIPINE BESYLATE 5 MG PO TABS: 5.0000 mg | ORAL_TABLET | Freq: Every day | ORAL | Status: DC

## 2016-04-11 NOTE — Consult Note (Signed)
Referring Physician: Dr. Myna Hidalgo    Chief Complaint: Intermittent right sided sensory numbness  HPI: Rebecca Hurley is an 76 y.o. female who presented to the ED on Tuesday afternoon with intermittent numbness involving her right arm, leg and right lower face. Her symptoms have occurred in the context of significant recent social/psychological stressors due to events at her church. She took her BP and noted that it was elevated. Numbness started in her RUE, initially lasting for a few minutes, but recurred. Numbness then progressed to her RLE, with waxing and waning of symptoms. She later developed numbness of her right neck and lower face. Of note, the symptoms come in "waves" and involve only portions of the above noted neuraxial segments, not involving them all at once. She took two 81 mg ASA prior to coming in to the ED for evaluation. She feels that after the ASA, her symptoms resolved. She also endorses mild headache.   EKG shows normal sinus rhythm. CT head showed no acute abnormality.   History reviewed. No pertinent past medical history.  History reviewed. No pertinent surgical history.  History reviewed. No pertinent family history. Social History:  reports that she has never smoked. She has never used smokeless tobacco. She reports that she does not drink alcohol. Her drug history is not on file.  Allergies:  Allergies  Allergen Reactions  . Sulfa Antibiotics Other (See Comments)    Severe reaction-per family  . Erythromycin Other (See Comments)    Potential ear troubles/ deafness  . Tylenol [Acetaminophen] Other (See Comments)    Altered mental status and mental changes    Home Medications: aspirin 325 MG tablet Take 650 mg by mouth every 6 (six) hours as needed for moderate pain.  Vianne Bulls, MD Not Ordered  CALCIUM-VITAMIN D PO Take 1 tablet by mouth 2 (two) times daily. Vianne Bulls, MD Not Ordered  CRANBERRY JUICE EXTRACT PO Take 1 tablet by mouth 3 (three) times a  week. Vianne Bulls, MD Not Ordered  Flaxseed, Linseed, (FLAX SEED OIL PO) Take 5 mLs by mouth daily. Vianne Bulls, MD Not Ordered  Ginkgo Biloba (GNP GINGKO BILOBA EXTRACT PO) Take 40 mg by mouth daily. Vianne Bulls, MD Not Ordered  glucosamine-chondroitin 500-400 MG tablet Take 1 tablet by mouth 2 (two) times daily.  Vianne Bulls, MD Not Ordered  IODINE, KELP, PO Take 1 tablet by mouth daily. Vianne Bulls, MD Not Ordered  LUTEIN PO Take 120 mg by mouth daily. Vianne Bulls, MD Not Ordered  vitamin C (ASCORBIC ACID) 500 MG tablet Take 500 mg by mouth 2 (two) times daily.  Vianne Bulls, MD Not Ordered     Intpatient Medications:   Current Facility-Administered Medications:  .  0.9 %  sodium chloride infusion, , Intravenous, Continuous, Ilene Qua Opyd, MD, Last Rate: 70 mL/hr at 04/10/16 2257 .  aspirin suppository 300 mg, 300 mg, Rectal, Daily **OR** aspirin tablet 325 mg, 325 mg, Oral, Daily, Timothy S Opyd, MD .  enoxaparin (LOVENOX) injection 40 mg, 40 mg, Subcutaneous, Q24H, Ilene Qua Opyd, MD, 40 mg at 04/10/16 2257 .  labetalol (NORMODYNE,TRANDATE) injection 5 mg, 5 mg, Intravenous, Q2H PRN, Timothy S Opyd, MD .  senna-docusate (Senokot-S) tablet 1 tablet, 1 tablet, Oral, QHS PRN, Ilene Qua Opyd, MD  ROS: Denies vision change, dysarthria, dysphagia, fever/chills, chest pain or focal weakness.   Physical Examination: Blood pressure (!) 135/52, pulse (!) 58, temperature 98.2 F (36.8 C), temperature  source Oral, resp. rate 10, height 5' 1" (1.549 m), weight 54.6 kg (120 lb 5.9 oz), SpO2 100 %.  HEENT: [place]/AT Lungs: Respirations unlabored Ext: No edema  Neurologic Examination: Mental Status: Alert, thought content appropriate.  Speech fluent without evidence of aphasia.  Able to follow all commands without difficulty. Cranial Nerves: II:  Visual fields intact, PERRL III,IV, VI: ptosis not present, EOMI without nystagmus V,VII: smile symmetric, facial sensation without  asymmetry VIII: HOH IX,X: No hypophonia or hoarseness XI: No asymmetry XII: midline tongue extension  Motor: Right : Upper extremity   5/5    Left:     Upper extremity   5/5  Lower extremity   5/5     Lower extremity   5/5 Normal tone throughout; no atrophy noted Sensory: Light touch intact x 4 without extinction. Subjectively unable to appreciate a slightly cool stimulus applied to forearms and distal legs bilaterally, with no asymmetry noted. Deep Tendon Reflexes:  2+ bilateral brachioradialis and biceps. 2+ patellae. Unable to test achilles reflexes definitively due to flinching/guarding.  Cerebellar: No ataxia with FNF bilaterally.  Gait: Deferred.    Results for orders placed or performed during the hospital encounter of 04/10/16 (from the past 48 hour(s))  CBC with Differential     Status: None   Collection Time: 04/10/16 12:58 PM  Result Value Ref Range   WBC 6.2 4.0 - 10.5 K/uL   RBC 4.77 3.87 - 5.11 MIL/uL   Hemoglobin 14.2 12.0 - 15.0 g/dL   HCT 43.3 36.0 - 46.0 %   MCV 90.8 78.0 - 100.0 fL   MCH 29.8 26.0 - 34.0 pg   MCHC 32.8 30.0 - 36.0 g/dL   RDW 13.4 11.5 - 15.5 %   Platelets 232 150 - 400 K/uL   Neutrophils Relative % 53 %   Neutro Abs 3.2 1.7 - 7.7 K/uL   Lymphocytes Relative 38 %   Lymphs Abs 2.4 0.7 - 4.0 K/uL   Monocytes Relative 8 %   Monocytes Absolute 0.5 0.1 - 1.0 K/uL   Eosinophils Relative 1 %   Eosinophils Absolute 0.1 0.0 - 0.7 K/uL   Basophils Relative 0 %   Basophils Absolute 0.0 0.0 - 0.1 K/uL  Basic metabolic panel     Status: None   Collection Time: 04/10/16 12:58 PM  Result Value Ref Range   Sodium 137 135 - 145 mmol/L   Potassium 4.0 3.5 - 5.1 mmol/L   Chloride 101 101 - 111 mmol/L   CO2 25 22 - 32 mmol/L   Glucose, Bld 98 65 - 99 mg/dL   BUN 16 6 - 20 mg/dL   Creatinine, Ser 0.68 0.44 - 1.00 mg/dL   Calcium 9.9 8.9 - 10.3 mg/dL   GFR calc non Af Amer >60 >60 mL/min   GFR calc Af Amer >60 >60 mL/min    Comment: (NOTE) The eGFR  has been calculated using the CKD EPI equation. This calculation has not been validated in all clinical situations. eGFR's persistently <60 mL/min signify possible Chronic Kidney Disease.    Anion gap 11 5 - 15  TSH     Status: None   Collection Time: 04/10/16  5:01 PM  Result Value Ref Range   TSH 3.122 0.350 - 4.500 uIU/mL    Comment: Performed by a 3rd Generation assay with a functional sensitivity of <=0.01 uIU/mL.  Magnesium     Status: None   Collection Time: 04/10/16  5:01 PM  Result Value Ref Range  Magnesium 2.0 1.7 - 2.4 mg/dL  I-Stat Troponin, ED (not at [facility])     Status: None   Collection Time: 04/10/16  5:24 PM  Result Value Ref Range   Troponin i, poc 0.05 0.00 - 0.08 ng/mL   Comment 3            Comment: Due to the release kinetics of cTnI, a negative result within the first hours of the onset of symptoms does not rule out myocardial infarction with certainty. If myocardial infarction is still suspected, repeat the test at appropriate intervals.    Ct Head Wo Contrast  Result Date: 04/10/2016 CLINICAL DATA:  76 year old female with right-sided arm and leg numbness. Episode of elevated blood pressure. Initial encounter. EXAM: CT HEAD WITHOUT CONTRAST TECHNIQUE: Contiguous axial images were obtained from the base of the skull through the vertex without intravenous contrast. COMPARISON:  None. FINDINGS: Brain: No intracranial hemorrhage. Small hypodensity left frontal region may represent result of chronic microvascular changes. Small acute infarct difficult to completely exclude. No CT evidence of large acute infarct. Mild to moderate chronic microvascular changes otherwise noted. Mild atrophy typical of age without hydrocephalus. No intracranial mass lesion noted on this unenhanced exam. Vascular: No hyperdense vessel. Skull: Negative Sinuses/Orbits: No acute orbital abnormality. Visualized paranasal sinuses are clear. Other: Negative. IMPRESSION: No intracranial  hemorrhage. Small hypodensity left frontal region may represent result of chronic microvascular changes. Small acute infarct difficult to completely exclude. No CT evidence of large acute infarct. Mild to moderate chronic microvascular changes otherwise noted. Electronically Signed   By: Genia Del M.D.   On: 04/10/2016 17:43   Mr Brain Wo Contrast  Result Date: 04/11/2016 CLINICAL DATA:  76 y/o F; numbness of right arm, leg, and right lower face. EXAM: MRI HEAD WITHOUT CONTRAST MRA HEAD WITHOUT CONTRAST TECHNIQUE: Multiplanar, multiecho pulse sequences of the brain and surrounding structures were obtained without intravenous contrast. Angiographic images of the head were obtained using MRA technique without contrast. COMPARISON:  04/10/2016 CT of the head. FINDINGS: MRI HEAD FINDINGS Brain: No acute infarction, hemorrhage, hydrocephalus, extra-axial collection or mass lesion. Nonspecific foci of T2 FLAIR hyperintense signal abnormality are present in subcortical and periventricular white matter compatible with mild chronic microvascular ischemic changes. FLAIR signal abnormality is inclusive of the lucent lesion seen on CT. Mild brain parenchymal volume loss. Vascular: As below. Skull and upper cervical spine: Normal marrow signal. Sinuses/Orbits: Negative. Other: None. MRA HEAD FINDINGS Internal carotid arteries:  Patent. Anterior cerebral arteries:  Patent. Middle cerebral arteries: Patent. Anterior communicating artery: Patent diminutive vessel. Posterior communicating arteries: Patent right. No left identified, likely hypoplastic or absent. Posterior cerebral arteries:  Patent. Basilar artery:  Patent. Vertebral arteries:  Patent. No evidence of high-grade stenosis, large vessel occlusion, or aneurysm. IMPRESSION: 1. No acute intracranial abnormality. 2. Mild chronic microvascular ischemic changes of white matter inclusive of the hypodense focus seen on CT. 3. Mild brain parenchymal volume loss. 4.  Unremarkable MRA of the head. No evidence of high-grade stenosis, large vessel occlusion, or aneurysm. Electronically Signed   By: Kristine Garbe M.D.   On: 04/11/2016 00:58   Mr Jodene Nam Head/brain NW Cm  Result Date: 04/11/2016 CLINICAL DATA:  76 y/o F; numbness of right arm, leg, and right lower face. EXAM: MRI HEAD WITHOUT CONTRAST MRA HEAD WITHOUT CONTRAST TECHNIQUE: Multiplanar, multiecho pulse sequences of the brain and surrounding structures were obtained without intravenous contrast. Angiographic images of the head were obtained using MRA technique without contrast. COMPARISON:  04/10/2016 CT of the head. FINDINGS: MRI HEAD FINDINGS Brain: No acute infarction, hemorrhage, hydrocephalus, extra-axial collection or mass lesion. Nonspecific foci of T2 FLAIR hyperintense signal abnormality are present in subcortical and periventricular white matter compatible with mild chronic microvascular ischemic changes. FLAIR signal abnormality is inclusive of the lucent lesion seen on CT. Mild brain parenchymal volume loss. Vascular: As below. Skull and upper cervical spine: Normal marrow signal. Sinuses/Orbits: Negative. Other: None. MRA HEAD FINDINGS Internal carotid arteries:  Patent. Anterior cerebral arteries:  Patent. Middle cerebral arteries: Patent. Anterior communicating artery: Patent diminutive vessel. Posterior communicating arteries: Patent right. No left identified, likely hypoplastic or absent. Posterior cerebral arteries:  Patent. Basilar artery:  Patent. Vertebral arteries:  Patent. No evidence of high-grade stenosis, large vessel occlusion, or aneurysm. IMPRESSION: 1. No acute intracranial abnormality. 2. Mild chronic microvascular ischemic changes of white matter inclusive of the hypodense focus seen on CT. 3. Mild brain parenchymal volume loss. 4. Unremarkable MRA of the head. No evidence of high-grade stenosis, large vessel occlusion, or aneurysm. Electronically Signed   By: Kristine Garbe M.D.   On: 04/11/2016 00:58    Assessment: 76 y.o. female with intermittent right sided sensory numbness in the context of severe HTN 1. Neurological exam is nonfocal.  2. MRI brain reveals no acute infarction. Mild chronic microvascular ischemic changes of white matter and mild brain parenchymal volume loss are noted. 3. Unremarkable MRA of the head.  4. Overall presentation most consistent with hypertensive urgency secondary to emotional/psychological stress. Her neurological symptoms most likely either stress-related or secondary to vasospasm in the setting of transient severely elevated BP. Clinical course of migrating sensory symptoms with waxing/waning is atypical for stroke/TIA.  5. Stroke Risk Factors - HTN  Plan: 1. HgbA1c, fasting lipid panel 2. Echocardiogram 3. Carotid dopplers 4. PT consult, OT consult, Speech consult 5. BP management. Advised to keep a BP diary at home with 3x/day readings to be entered for review by her PCP.  6. Telemetry monitoring 7. Frequent neuro checks 8. Continue ASA.  9. Unclear if statin is indicated as clinical picture most consistent with hypertensive urgency or stress related neurological symptoms rather than stroke. Await results of fastin lipid panel.   _0  signed: Dr. Kerney Elbe 04/11/2016, 1:54 AM

## 2016-04-11 NOTE — Progress Notes (Signed)
  Echocardiogram 2D Echocardiogram has been performed.  Cabela Pacifico T Preet Perrier 04/11/2016, 4:19 PM

## 2016-04-11 NOTE — Progress Notes (Addendum)
*  PRELIMINARY RESULTS* Vascular Ultrasound Carotid Duplex (Doppler) has been completed.  Preliminary findings: Right 1-39% ICA stenosis, borderline >40%. Left 40-59% Distal ICA stenosis (tortuous vessel but plaque is visualized at area of increased velocity)    Farrel Demark, RDMS, RVT  04/11/2016, 2:26 PM

## 2016-04-11 NOTE — Progress Notes (Signed)
Chart reviewed. Spoke to pt and husband at bedside. No NCM needs identified. Isidoro Donning RN CCM Case Mgmt phone [phone]

## 2016-04-11 NOTE — Progress Notes (Signed)
Discharge orders received.  Discharge instructions and follow-up appointments reviewed with the patient and her husband.  VSS upon discharge.  IV removed and education complete.  Transported out via wheelchair.   Auden Tatar M, RN 

## 2016-04-11 NOTE — Progress Notes (Signed)
STROKE TEAM PROGRESS NOTE   SUBJECTIVE (INTERVAL HISTORY) No family is at the bedside.  She recounted HPI with Dr. Pearlean Brownie. Started as tingling in her leg. BP elevated. She called husband who came home from work and brought her to the hospital. Sx only lasted a minute or so each time - moved from calf to face to shoulder. Patient reports having a lot of stress - a situation at her church. Friend lost her daughter. These things upset her. She took 2 full dose aspirin prior to arrival to the hospital.    OBJECTIVE Temp:  [97.9 F (36.6 C)-98.4 F (36.9 C)] 98.4 F (36.9 C) (04/04 1002) Pulse Rate:  [55-76] 62 (04/04 1002) Cardiac Rhythm: Sinus bradycardia (04/04 0700) Resp:  [12-23] 16 (04/04 1002) BP: (99-199)/(47-98) 150/59 (04/04 1002) SpO2:  [96 %-100 %] 100 % (04/04 1002) Weight:  [54.6 kg (120 lb 5.9 oz)] 54.6 kg (120 lb 5.9 oz) (04/03 2120)  CBC:   Recent Labs Lab 04/10/16 1258  WBC 6.2  NEUTROABS 3.2  HGB 14.2  HCT 43.3  MCV 90.8  PLT 232    Basic Metabolic Panel:   Recent Labs Lab 04/10/16 1258 04/10/16 1701 04/11/16 0850  NA 137  --   --   K 4.0  --   --   CL 101  --   --   CO2 25  --   --   GLUCOSE 98  --   --   BUN 16  --   --   CREATININE 0.68  --   --   CALCIUM 9.9  --   --   MG  --  2.0 1.9   HgbA1c: No results found for: HGBA1C   PHYSICAL EXAM [name] not in distress. . Afebrile. Head is nontraumatic. Neck is supple without bruit.    Cardiac exam no murmur or gallop. Lungs are clear to auscultation. Distal pulses are well felt. Neurological Exam ;  Awake  Alert oriented x 3. Normal speech and language.eye movements full without nystagmus.fundi were not visualized. Vision acuity and fields appear normal. Hearing is normal. Palatal movements are normal. Face symmetric. Tongue midline. Normal strength, tone, reflexes and coordination. Normal sensation. Gait deferred.  ASSESSMENT/PLAN Rebecca Hurley is a 76 y.o. female  with no significant past medical history presenting with R sided tinlging. She did not receive IV t-PA.   TIA like symptoms but likely underlying stress and anxeity  Resultant  Neuro deficits resolved  CT head no hmg. Small L frontal hypodensity. no infarct. small vessel disease.   MRI  No acute stroke. small vessel disease. Atrophy.  MRA  Unremarkable   Carotid Doppler  pending   2D Echo  pending   LDL 117  HgbA1c pending  Lovenox 40 mg sq daily for VTE prophylaxis Diet regular Room service appropriate? Yes; Fluid consistency: Thin  aspirin 325 mg daily prior to admission, now on aspirin 325 mg daily  Therapy recommendations:  No OT  Disposition:  Anticipate return home  Hypertensive Urgency  BP as high as 199/89 on arrival in setting of neuro symptoms  Down as low as 99/47 during the night  Not on antihypertensives at home BP goal normotensive  Other Stroke Risk Factors  Advanced age  UDS not performed  Hospital day # 0  Rebecca Hurley [facility] See Amion for Pager information 04/11/2016 11:10 AM  I have personally examined this patient, reviewed notes, independently viewed imaging studies, participated in  medical decision making and plan of care.ROS completed by me personally and pertinent positives fully documented  I have made any additions or clarifications directly to the above note. Agree with note above. She presented with transient collecting paresthesias involving the right cough, right triceps and right neck lasting barely a few minutes in the non-vascular distribution pattern. I doubt this represents TIA likely due to underlying anxiety. Recommend aspirin 81 mg daily and finish ongoing stroke workup. Discussed with patient and Dr. Janee Morn and answered questions. Greater than 50% time during this 25 minute visit was spent on counseling and coordination of care about her symptoms, TIA workup and answered questions.  Delia Heady, MD Medical  Director [facility] Pager: [phone] 04/11/2016 1:09 PM  To contact Stroke Continuity provider, please refer to [email or web address]. After hours, contact General Neurology

## 2016-04-11 NOTE — Evaluation (Signed)
Occupational Therapy Evaluation and Discharge Patient Details Name: Rebecca Hurley MRN: [id number] DOB: 07-17-1940 Today's Date: 04/11/2016    History of Present Illness Pt is an 76 y.o. female who presented to the ED on Tuesday afternoon with intermittent numbness involving her right arm, leg and right lower face. She also had a mild headache. Her symptoms have resolved.   Clinical Impression   PTA Pt independent in ADL/IADL and mobility. Pt is back at baseline and is independent in ADL/mobility. Please see performance level below. Pt did not need any assistance during session and was able to perform higher level balance activities (no formal standard balance test completed). Pt able to read from menu and find items around room visually. Pt does not need further OT services. Thank you for this referral. OT to sign off.    Follow Up Recommendations  No OT follow up    Equipment Recommendations  None recommended by OT    Recommendations for Other Services       Precautions / Restrictions Precautions Precautions: None Restrictions Weight Bearing Restrictions: No      Mobility Bed Mobility Overal bed mobility: Independent             General bed mobility comments: bed flat, no rails  Transfers Overall transfer level: Independent Equipment used: None             General transfer comment: Pt able to manage her own IV pole, but able to walk without it    Balance Overall balance assessment: Independent (Pt able to stand & reach, step over and, pick up objects)                                         ADL either performed or assessed with clinical judgement   ADL Overall ADL's : Independent                                       General ADL Comments: Pt able to ambulate to bathroom, perform toileting including clothing management and peri care, sink level grooming (including sifting through a bin of personal care items,  manipulating grooming tools, performing bilateral hand tasks), and lower body dressing with no problems at all.      Vision Baseline Vision/History: Wears glasses Wears Glasses: At all times Patient Visual Report: No change from baseline Vision Assessment?: Yes Eye Alignment: Within Functional Limits Ocular Range of Motion: Within Functional Limits Alignment/Gaze Preference: Within Defined Limits Tracking/Visual Pursuits: Able to track stimulus in all quads without difficulty Convergence: Within functional limits Visual Fields: No apparent deficits Additional Comments: Pt has been seeing her eye doctor and per Pt report there is extra fluid and so she's had baseline blurryness for about a month     Perception     Praxis      Pertinent Vitals/Pain Pain Assessment: No/denies pain     Hand Dominance Right   Extremity/Trunk Assessment Upper Extremity Assessment Upper Extremity Assessment: Overall WFL for tasks assessed   Lower Extremity Assessment Lower Extremity Assessment: Overall WFL for tasks assessed   Cervical / Trunk Assessment Cervical / Trunk Assessment: Normal   Communication Communication Communication: HOH   Cognition Arousal/Alertness: Awake/alert Behavior During Therapy: WFL for tasks assessed/performed Overall Cognitive Status: Within Functional Limits for tasks assessed  General Comments       Exercises     Shoulder Instructions      Home Living Family/patient expects to be discharged to:: Private residence Living Arrangements: Spouse/significant other Available Help at Discharge: Family;Available PRN/intermittently Type of Home: House Home Access: Stairs to enter [employer] of Steps: 3 Entrance Stairs-Rails: None Home Layout: Multi-level;Laundry or work area in basement;Able to live on main level with bedroom/bathroom Alternate Teacher, music of Steps: flight Alternate Level  Stairs-Rails: Right Bathroom Shower/Tub: Doctor, general practice: None   Additional Comments[place], likes to work outside/garden      Prior Functioning/Environment Level of Independence: Independent        Comments: driving, works outside in garden a lot, very active in church        OT Problem List:        OT Treatment/Interventions:      OT Goals(Current goals can be found in the care plan section) Acute Rehab OT Goals Patient Stated Goal: to get back home OT Goal Formulation: With patient Time For Goal Achievement: 04/18/16 Potential to Achieve Goals: Good  OT Frequency:     Barriers to D/C:            Co-evaluation              End of Session Equipment Utilized During Treatment: Gait belt Nurse Communication: Mobility status  Activity Tolerance: Patient tolerated treatment well Patient left: in chair  OT Visit Diagnosis: Hemiplegia and hemiparesis Hemiplegia - Right/Left: Right Hemiplegia - dominant/non-dominant: Dominant Hemiplegia - caused by: Unspecified                Time: 2440-1027 OT Time Calculation (min): 32 min Charges:  OT General Charges $OT Visit: 1 Procedure OT Evaluation $OT Eval Low Complexity: 1 Procedure OT Treatments $Self Care/Home Management : 8-22 mins G-Codes: OT G-codes **NOT FOR INPATIENT CLASS** Functional Assessment Tool Used: AM-PAC 6 Clicks Daily Activity Functional Limitation: Self care Self Care Current Status (O5366): 0 percent impaired, limited or restricted Self Care Goal Status (Y4034): 0 percent impaired, limited or restricted Self Care Discharge Status (V4259): 0 percent impaired, limited or restricted   Sherryl Manges OTR/L [phone]  Rebecca Hurley 04/11/2016, 9:25 AM

## 2016-04-11 NOTE — Care Management Obs Status (Signed)
MEDICARE OBSERVATION STATUS NOTIFICATION   Patient Details  Name: Rebecca Hurley MRN: [id number] Date of Birth: 02/11/1940   Medicare Observation Status Notification Given:  Yes    Elliot Cousin, RN 04/11/2016, 5:49 PM

## 2016-04-11 NOTE — Progress Notes (Signed)
PT Cancellation Note/Discharge  Patient Details Name: Rebecca Hurley MRN: [id number] DOB: Nov 03, 1940   Cancelled Treatment:    Reason Eval/Treat Not Completed: OT screened, no needs identified, will sign off.  OT screened for PT needs.  No needs identified at this time.  See OT notes for details, PT to sign off.    Thanks,    Rollene Rotunda. Zuly Belkin, PT, DPT [phone]   04/11/2016, 12:12 PM

## 2016-04-11 NOTE — Discharge Summary (Signed)
Physician Discharge Summary  [name] ZOX:[id number] DOB: 03-08-1940 DOA: 04/10/2016  PCP: Roxanne Mins, PA-C  Admit date: 04/10/2016 Discharge date: 04/11/2016  Time spent: 60 minutes  Recommendations for Outpatient Follow-up:  1. Follow-up with Roxanne Mins, PA-C in 1-2 weeks. On follow-up patient's blood pressure needs to be reassessed and further blood pressure control managed if needed.   Discharge Diagnoses:  Principal Problem:   Numbness on right side Active Problems:   Hypertensive urgency   Discharge Condition: Stable and improved  Diet recommendation: Regular  Filed Weights   04/10/16 2120  Weight: 54.6 kg (120 lb 5.9 oz)    History of present illness:  Per Dr. [name] is a 76 y.o. female who denied any significant past medical history, now presenting to the emergency department for numbness involving the right arm, right leg, and right lower face. 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 She had otherwise been in her usual state of health with no recent fevers or chills, no chest pain or palpitations, and no recent headache, change in vision or hearing, or focal weakness. She described the insidious development of numbness involving the right arm earlier on the day of admission, then progressing to involve the right leg as well. These symptoms seemed to wax and wane over the course of the day and she later developed numbness into her right neck and right lower face. By this time, the patient was becoming quite concerned with the symptoms and sought evaluation in the ED. She took 2 baby aspirins prior to coming in and reported that the symptoms seemed to resolve following that. She had never experienced similar symptoms previously. There's been no recent fall or trauma.  ED Course: Upon arrival to the ED, patient is found to be afebrile, saturating well on room  air, hypertensive to 199/89, and with vitals otherwise stable. EKG features a normal sinus rhythm and noncontrast head CT is negative for hemorrhage, but notable for small hypodensity in the left frontal region which may represent chronic microvascular changes, but could also possibly represent a small acute infarct. Chemistry panel is unremarkable, CBC is within the normal limits, TSH is normal, and troponin is also in the normal range. Patient was treated with 3 and 24 mg of aspirin at 0.5 mg IV Ativan. Neuro was consulted by the ED physician and advised treating the patient as if the CT finding does represent a small acute infarct in until it can be further characterized. She has remained hypertensive, but otherwise stable in the ED and will be observed on telemetry unit for ongoing evaluation and management of transient right-sided numbness with hypertensive urgency and noncontrast head CT finding of uncertain significance.  Hospital Course:  #1 right-sided numbness/TIA-like symptoms but likely secondary to underlying stress and anxiety Patient was admitted with a sensation of right-sided numbness in the setting of hypertensive urgency which improved with 2 baby aspirins. Patient was seen in consultation by neurology recommended 23 hour observation with stroke workup. CT head which was done was negative for any acute abnormalities. MRI/MRA of the head which was done was negative for any acute stroke. Carotid Dopplers were done with preliminary findings of 1-39% right ICA stenosis. Left 40-59% distal ICA stenosis. Patient was placed on full dose aspirin for secondary stroke prevention. 2-D echo was obtained with a ejection fraction of 60-65%, no wall motion abnormalities, grade 1 diastolic dysfunction, normal size  right atrium, mild tricuspid valvular regurgitation. No source of emboli. Fasting lipid panel obtained had a LDL of 117. Patient improved clinically did not have any further symptoms. Patient was  followed by neurology who felt patient's symptoms were likely secondary to stress related and anxiety. Was recommended that patient be discharged on aspirin 81 mg daily with better blood pressure control. Patient be discharged home in stable and improved condition and will follow-up with PCP in the outpatient setting.  #2 hypertensive urgency On admission patient was noted to be in hypertensive urgency with symptoms of right-sided numbness as well as systolic blood pressure of 199. Patient blood pressure fluctuated between a systolic of 99-199. Patient was subsequently started on oral Norvasc 2.5 mg daily. Patient was a little hesitant to be started on anti-hypertensive medications however was willing to try a low dose. Patient will follow-up with PCP the outpatient setting for further monitoring and better blood pressure control. Patient will be discharged in stable and improved condition.  Procedures:  2-D echo 04/11/2016  Carotid Dopplers 04/11/2016  CT head 04/10/2016  MRI head/MRA head 04/10/2016  Consultations:  Neurology: Dr Otelia Limes 04/11/2016  Discharge Exam: Vitals:   04/11/16 1002 04/11/16 1348  BP: (!) 150/59 (!) 152/63  Pulse: 62 (!) 59  Resp: 16 16  Temp: 98.4 F (36.9 C) 98.6 F (37 C)    General: NAD Cardiovascular: RRR Respiratory: CTAB  Discharge Instructions   Discharge Instructions    Diet general    Complete by:  As directed    Increase activity slowly    Complete by:  As directed      Current Discharge Medication List    START taking these medications   Details  amLODipine (NORVASC) 2.5 MG tablet Take 1 tablet (2.5 mg total) by mouth daily. Qty: 30 tablet, Refills: 0    aspirin EC 81 MG tablet Take 1 tablet (81 mg total) by mouth daily.      CONTINUE these medications which have NOT CHANGED   Details  CALCIUM-VITAMIN D PO Take 1 tablet by mouth 2 (two) times daily.    CRANBERRY JUICE EXTRACT PO Take 1 tablet by mouth 3 (three) times a  week.    Flaxseed, Linseed, (FLAX SEED OIL PO) Take 5 mLs by mouth daily.    Ginkgo Biloba (GNP GINGKO BILOBA EXTRACT PO) Take 40 mg by mouth daily.    glucosamine-chondroitin 500-400 MG tablet Take 1 tablet by mouth 2 (two) times daily.      IODINE, KELP, PO Take 1 tablet by mouth daily.    LUTEIN PO Take 120 mg by mouth daily.    vitamin C (ASCORBIC ACID) 500 MG tablet Take 500 mg by mouth 2 (two) times daily.       STOP taking these medications     aspirin 325 MG tablet        Allergies  Allergen Reactions  . Sulfa Antibiotics Other (See Comments)    Severe reaction-per family  . Erythromycin Other (See Comments)    Potential ear troubles/ deafness  . Tylenol [Acetaminophen] Other (See Comments)    Altered mental status and mental changes   Follow-up Information    Roxanne Mins, PA-C. Schedule an appointment as soon as possible for a visit.   Specialty:  Cardiology Why:  F/U 1-2 WEEKS. Contact information: [facility]  [address] [place] [address] [phone]            The results of significant diagnostics from this  hospitalization (including imaging, microbiology, ancillary and laboratory) are listed below for reference.    Significant Diagnostic Studies: Ct Head Wo Contrast  Result Date: 04/10/2016 CLINICAL DATA:  76 year old female with right-sided arm and leg numbness. Episode of elevated blood pressure. Initial encounter. EXAM: CT HEAD WITHOUT CONTRAST TECHNIQUE: Contiguous axial images were obtained from the base of the skull through the vertex without intravenous contrast. COMPARISON:  None. FINDINGS: Brain: No intracranial hemorrhage. Small hypodensity left frontal region may represent result of chronic microvascular changes. Small acute infarct difficult to completely exclude. No CT evidence of large acute infarct. Mild to moderate chronic microvascular changes otherwise noted. Mild atrophy typical of age without hydrocephalus.  No intracranial mass lesion noted on this unenhanced exam. Vascular: No hyperdense vessel. Skull: Negative Sinuses/Orbits: No acute orbital abnormality. Visualized paranasal sinuses are clear. Other: Negative. IMPRESSION: No intracranial hemorrhage. Small hypodensity left frontal region may represent result of chronic microvascular changes. Small acute infarct difficult to completely exclude. No CT evidence of large acute infarct. Mild to moderate chronic microvascular changes otherwise noted. Electronically Signed   By: Lacy Duverney M.D.   On: 04/10/2016 17:43   Mr Brain Wo Contrast  Result Date: 04/11/2016 CLINICAL DATA:  76 y/o F; numbness of right arm, leg, and right lower face. EXAM: MRI HEAD WITHOUT CONTRAST MRA HEAD WITHOUT CONTRAST TECHNIQUE: Multiplanar, multiecho pulse sequences of the brain and surrounding structures were obtained without intravenous contrast. Angiographic images of the head were obtained using MRA technique without contrast. COMPARISON:  04/10/2016 CT of the head. FINDINGS: MRI HEAD FINDINGS Brain: No acute infarction, hemorrhage, hydrocephalus, extra-axial collection or mass lesion. Nonspecific foci of T2 FLAIR hyperintense signal abnormality are present in subcortical and periventricular white matter compatible with mild chronic microvascular ischemic changes. FLAIR signal abnormality is inclusive of the lucent lesion seen on CT. Mild brain parenchymal volume loss. Vascular: As below. Skull and upper cervical spine: Normal marrow signal. Sinuses/Orbits: Negative. Other: None. MRA HEAD FINDINGS Internal carotid arteries:  Patent. Anterior cerebral arteries:  Patent. Middle cerebral arteries: Patent. Anterior communicating artery: Patent diminutive vessel. Posterior communicating arteries: Patent right. No left identified, likely hypoplastic or absent. Posterior cerebral arteries:  Patent. Basilar artery:  Patent. Vertebral arteries:  Patent. No evidence of high-grade stenosis, large  vessel occlusion, or aneurysm. IMPRESSION: 1. No acute intracranial abnormality. 2. Mild chronic microvascular ischemic changes of white matter inclusive of the hypodense focus seen on CT. 3. Mild brain parenchymal volume loss. 4. Unremarkable MRA of the head. No evidence of high-grade stenosis, large vessel occlusion, or aneurysm. Electronically Signed   By: Mitzi Hansen M.D.   On: 04/11/2016 00:58   Mr Maxine Glenn Head/brain ZO Cm  Result Date: 04/11/2016 CLINICAL DATA:  76 y/o F; numbness of right arm, leg, and right lower face. EXAM: MRI HEAD WITHOUT CONTRAST MRA HEAD WITHOUT CONTRAST TECHNIQUE: Multiplanar, multiecho pulse sequences of the brain and surrounding structures were obtained without intravenous contrast. Angiographic images of the head were obtained using MRA technique without contrast. COMPARISON:  04/10/2016 CT of the head. FINDINGS: MRI HEAD FINDINGS Brain: No acute infarction, hemorrhage, hydrocephalus, extra-axial collection or mass lesion. Nonspecific foci of T2 FLAIR hyperintense signal abnormality are present in subcortical and periventricular white matter compatible with mild chronic microvascular ischemic changes. FLAIR signal abnormality is inclusive of the lucent lesion seen on CT. Mild brain parenchymal volume loss. Vascular: As below. Skull and upper cervical spine: Normal marrow signal. Sinuses/Orbits: Negative. Other: None. MRA HEAD FINDINGS Internal carotid arteries:  Patent. Anterior cerebral arteries:  Patent. Middle cerebral arteries: Patent. Anterior communicating artery: Patent diminutive vessel. Posterior communicating arteries: Patent right. No left identified, likely hypoplastic or absent. Posterior cerebral arteries:  Patent. Basilar artery:  Patent. Vertebral arteries:  Patent. No evidence of high-grade stenosis, large vessel occlusion, or aneurysm. IMPRESSION: 1. No acute intracranial abnormality. 2. Mild chronic microvascular ischemic changes of white matter  inclusive of the hypodense focus seen on CT. 3. Mild brain parenchymal volume loss. 4. Unremarkable MRA of the head. No evidence of high-grade stenosis, large vessel occlusion, or aneurysm. Electronically Signed   By: Mitzi Hansen M.D.   On: 04/11/2016 00:58    Microbiology: No results found for this or any previous visit (from the past 240 hour(s)).   Labs: Basic Metabolic Panel:  Recent Labs Lab 04/10/16 1258 04/10/16 1701 04/11/16 0850  NA 137  --   --   K 4.0  --   --   CL 101  --   --   CO2 25  --   --   GLUCOSE 98  --   --   BUN 16  --   --   CREATININE 0.68  --   --   CALCIUM 9.9  --   --   MG  --  2.0 1.9   Liver Function Tests:  Recent Labs Lab 04/11/16 0249  AST 23  ALT 11*  ALKPHOS 51  BILITOT 0.8  PROT 5.5*  ALBUMIN 3.1*   No results for input(s): LIPASE, AMYLASE in the last 168 hours. No results for input(s): AMMONIA in the last 168 hours. CBC:  Recent Labs Lab 04/10/16 1258  WBC 6.2  NEUTROABS 3.2  HGB 14.2  HCT 43.3  MCV 90.8  PLT 232   Cardiac Enzymes:  Recent Labs Lab 04/11/16 0249  CKTOTAL 89   BNP: BNP (last 3 results) No results for input(s): BNP in the last 8760 hours.  ProBNP (last 3 results) No results for input(s): PROBNP in the last 8760 hours.  CBG: No results for input(s): GLUCAP in the last 168 hours.     SignedRamiro Harvest MD.  [facility] 04/11/2016, 5:35 PM

## 2016-04-12 LAB — HEMOGLOBIN A1C
Hgb A1c MFr Bld: 5.3 % (ref 4.8–5.6)
Mean Plasma Glucose: 105 mg/dL

## 2016-04-12 LAB — VAS US CAROTID
LEFT ECA DIAS: -8 cm/s
LEFT VERTEBRAL DIAS: 16 cm/s
Left CCA dist dias: -15 cm/s
Left CCA dist sys: -79 cm/s
Left CCA prox dias: 19 cm/s
Left CCA prox sys: 122 cm/s
Left ICA dist dias: -50 cm/s
Left ICA dist sys: -210 cm/s
Left ICA prox dias: -20 cm/s
Left ICA prox sys: -81 cm/s
RIGHT ECA DIAS: -9 cm/s
RIGHT VERTEBRAL DIAS: 11 cm/s
Right CCA prox dias: 12 cm/s
Right CCA prox sys: 94 cm/s
Right cca dist sys: -78 cm/s

## 2018-08-12 ENCOUNTER — Emergency Department (HOSPITAL_COMMUNITY)
Admission: EM | Admit: 2018-08-12 | Discharge: 2018-08-13 | Discharge status: Against Medical Advice | Payer: Medicare Other | Attending: Emergency Medicine | Admitting: Emergency Medicine

## 2018-08-12 ENCOUNTER — Other Ambulatory Visit: Payer: Self-pay

## 2018-08-12 ENCOUNTER — Emergency Department (HOSPITAL_COMMUNITY): Payer: Medicare Other

## 2018-08-12 ENCOUNTER — Encounter (HOSPITAL_COMMUNITY): Payer: Self-pay

## 2018-08-12 DIAGNOSIS — Z5321 Procedure and treatment not carried out due to patient leaving prior to being seen by health care provider: Secondary | ICD-10-CM | POA: Insufficient documentation

## 2018-08-12 DIAGNOSIS — R509 Fever, unspecified: Secondary | ICD-10-CM | POA: Diagnosis present

## 2018-08-12 LAB — CBC WITH DIFFERENTIAL/PLATELET
Abs Immature Granulocytes: 0.04 10*3/uL (ref 0.00–0.07)
Basophils Absolute: 0 10*3/uL (ref 0.0–0.1)
Basophils Relative: 1 %
Eosinophils Absolute: 0.1 10*3/uL (ref 0.0–0.5)
Eosinophils Relative: 1 %
HCT: 43.4 % (ref 36.0–46.0)
Hemoglobin: 14.2 g/dL (ref 12.0–15.0)
Immature Granulocytes: 1 %
Lymphocytes Relative: 24 %
Lymphs Abs: 2 10*3/uL (ref 0.7–4.0)
MCH: 30.3 pg (ref 26.0–34.0)
MCHC: 32.7 g/dL (ref 30.0–36.0)
MCV: 92.7 fL (ref 80.0–100.0)
Monocytes Absolute: 1 10*3/uL (ref 0.1–1.0)
Monocytes Relative: 12 %
Neutro Abs: 5.4 10*3/uL (ref 1.7–7.7)
Neutrophils Relative %: 61 %
Platelets: 243 10*3/uL (ref 150–400)
RBC: 4.68 MIL/uL (ref 3.87–5.11)
RDW: 13.2 % (ref 11.5–15.5)
WBC: 8.6 10*3/uL (ref 4.0–10.5)
nRBC: 0 % (ref 0.0–0.2)

## 2018-08-12 LAB — COMPREHENSIVE METABOLIC PANEL
ALT: 15 U/L (ref 0–44)
AST: 25 U/L (ref 15–41)
Albumin: 3.8 g/dL (ref 3.5–5.0)
Alkaline Phosphatase: 56 U/L (ref 38–126)
Anion gap: 10 (ref 5–15)
BUN: 9 mg/dL (ref 8–23)
CO2: 26 mmol/L (ref 22–32)
Calcium: 9.3 mg/dL (ref 8.9–10.3)
Chloride: 99 mmol/L (ref 98–111)
Creatinine, Ser: 0.75 mg/dL (ref 0.44–1.00)
GFR calc Af Amer: >60 mL/min (ref >60)
GFR calc non Af Amer: >60 mL/min (ref >60)
Glucose, Bld: 107 mg/dL — ABNORMAL HIGH (ref 70–99)
Potassium: 3.7 mmol/L (ref 3.5–5.1)
Sodium: 135 mmol/L (ref 135–145)
Total Bilirubin: 0.5 mg/dL (ref 0.3–1.2)
Total Protein: 6.8 g/dL (ref 6.5–8.1)

## 2018-08-12 LAB — LACTIC ACID, PLASMA: Lactic Acid, Venous: 0.8 mmol/L (ref 0.5–1.9)

## 2018-08-12 MED ORDER — SODIUM CHLORIDE 0.9% FLUSH: 3.0000 mL | Freq: Once | INTRAVENOUS | Status: DC

## 2018-08-12 NOTE — ED Triage Notes (Signed)
Pt reports she has been running a low grade fever. She reports she has had some disorientation as well this week and some hypertension. She reports she could have UTI because she has been having some lower abdominal pain. Hx of UTI.

## 2018-08-12 NOTE — ED Notes (Signed)
Patient has left. °

## 2018-08-13 DIAGNOSIS — I10 Essential (primary) hypertension: Secondary | ICD-10-CM | POA: Insufficient documentation

## 2018-08-13 DIAGNOSIS — Z2821 Immunization not carried out because of patient refusal: Secondary | ICD-10-CM | POA: Insufficient documentation

## 2018-08-13 DIAGNOSIS — H903 Sensorineural hearing loss, bilateral: Secondary | ICD-10-CM | POA: Insufficient documentation

## 2018-09-04 DIAGNOSIS — M542 Cervicalgia: Secondary | ICD-10-CM | POA: Insufficient documentation

## 2018-10-16 ENCOUNTER — Other Ambulatory Visit: Payer: Self-pay

## 2018-10-16 ENCOUNTER — Emergency Department (HOSPITAL_COMMUNITY): Payer: Medicare Other

## 2018-10-16 ENCOUNTER — Encounter (HOSPITAL_COMMUNITY): Admission: EM | Disposition: A | Discharge status: Skilled Nursing Facility | Payer: Self-pay | Source: Home / Self Care

## 2018-10-16 ENCOUNTER — Encounter (HOSPITAL_COMMUNITY): Payer: Self-pay

## 2018-10-16 ENCOUNTER — Emergency Department (HOSPITAL_COMMUNITY): Payer: Medicare Other | Admitting: Certified Registered Nurse Anesthetist

## 2018-10-16 ENCOUNTER — Inpatient Hospital Stay (HOSPITAL_COMMUNITY)
Admission: EM | Admit: 2018-10-16 | Discharge: 2018-10-23 | DRG: 330 | Disposition: A | Discharge status: Skilled Nursing Facility | Payer: Medicare Other | Attending: General Surgery | Admitting: General Surgery

## 2018-10-16 ENCOUNTER — Inpatient Hospital Stay (HOSPITAL_COMMUNITY): Payer: Medicare Other

## 2018-10-16 DIAGNOSIS — H9190 Unspecified hearing loss, unspecified ear: Secondary | ICD-10-CM | POA: Diagnosis present

## 2018-10-16 DIAGNOSIS — S71131A Puncture wound without foreign body, right thigh, initial encounter: Secondary | ICD-10-CM | POA: Diagnosis present

## 2018-10-16 DIAGNOSIS — Z20828 Contact with and (suspected) exposure to other viral communicable diseases: Secondary | ICD-10-CM | POA: Diagnosis present

## 2018-10-16 DIAGNOSIS — Z23 Encounter for immunization: Secondary | ICD-10-CM

## 2018-10-16 DIAGNOSIS — S31139A Puncture wound of abdominal wall without foreign body, unspecified quadrant without penetration into peritoneal cavity, initial encounter: Secondary | ICD-10-CM

## 2018-10-16 DIAGNOSIS — S81031A Puncture wound without foreign body, right knee, initial encounter: Secondary | ICD-10-CM | POA: Diagnosis present

## 2018-10-16 DIAGNOSIS — K660 Peritoneal adhesions (postprocedural) (postinfection): Secondary | ICD-10-CM | POA: Diagnosis present

## 2018-10-16 DIAGNOSIS — R110 Nausea: Secondary | ICD-10-CM | POA: Diagnosis present

## 2018-10-16 DIAGNOSIS — S36499A Other injury of unspecified part of small intestine, initial encounter: Principal | ICD-10-CM | POA: Diagnosis present

## 2018-10-16 DIAGNOSIS — S36899A Unspecified injury of other intra-abdominal organs, initial encounter: Secondary | ICD-10-CM | POA: Diagnosis present

## 2018-10-16 DIAGNOSIS — D62 Acute posthemorrhagic anemia: Secondary | ICD-10-CM | POA: Diagnosis present

## 2018-10-16 DIAGNOSIS — Y249XXA Unspecified firearm discharge, undetermined intent, initial encounter: Secondary | ICD-10-CM

## 2018-10-16 DIAGNOSIS — W3400XA Accidental discharge from unspecified firearms or gun, initial encounter: Secondary | ICD-10-CM | POA: Diagnosis not present

## 2018-10-16 DIAGNOSIS — I10 Essential (primary) hypertension: Secondary | ICD-10-CM | POA: Diagnosis present

## 2018-10-16 HISTORY — PX: LAPAROTOMY: SHX154

## 2018-10-16 HISTORY — PX: SMALL BOWEL REPAIR: SHX6447

## 2018-10-16 HISTORY — PX: BOWEL RESECTION: SHX1257

## 2018-10-16 LAB — CBC
HCT: 38.5 % (ref 36.0–46.0)
Hemoglobin: 12.8 g/dL (ref 12.0–15.0)
MCH: 30.4 pg (ref 26.0–34.0)
MCHC: 33.2 g/dL (ref 30.0–36.0)
MCV: 91.4 fL (ref 80.0–100.0)
Platelets: 226 10*3/uL (ref 150–400)
RBC: 4.21 MIL/uL (ref 3.87–5.11)
RDW: 13.3 % (ref 11.5–15.5)
WBC: 8.6 10*3/uL (ref 4.0–10.5)
nRBC: 0 % (ref 0.0–0.2)

## 2018-10-16 LAB — COMPREHENSIVE METABOLIC PANEL
ALT: 16 U/L (ref 0–44)
AST: 30 U/L (ref 15–41)
Albumin: 3.7 g/dL (ref 3.5–5.0)
Alkaline Phosphatase: 59 U/L (ref 38–126)
Anion gap: 13 (ref 5–15)
BUN: 21 mg/dL (ref 8–23)
CO2: 22 mmol/L (ref 22–32)
Calcium: 9.3 mg/dL (ref 8.9–10.3)
Chloride: 96 mmol/L — ABNORMAL LOW (ref 98–111)
Creatinine, Ser: 0.96 mg/dL (ref 0.44–1.00)
GFR calc Af Amer: >60 mL/min (ref >60)
GFR calc non Af Amer: 56 mL/min — ABNORMAL LOW (ref >60)
Glucose, Bld: 146 mg/dL — ABNORMAL HIGH (ref 70–99)
Potassium: 4 mmol/L (ref 3.5–5.1)
Sodium: 131 mmol/L — ABNORMAL LOW (ref 135–145)
Total Bilirubin: 0.5 mg/dL (ref 0.3–1.2)
Total Protein: 6.2 g/dL — ABNORMAL LOW (ref 6.5–8.1)

## 2018-10-16 LAB — I-STAT CHEM 8, ED
BUN: 21 mg/dL (ref 8–23)
Calcium, Ion: 1.12 mmol/L — ABNORMAL LOW (ref 1.15–1.40)
Chloride: 98 mmol/L (ref 98–111)
Creatinine, Ser: 0.8 mg/dL (ref 0.44–1.00)
Glucose, Bld: 139 mg/dL — ABNORMAL HIGH (ref 70–99)
HCT: 39 % (ref 36.0–46.0)
Hemoglobin: 13.3 g/dL (ref 12.0–15.0)
Potassium: 4 mmol/L (ref 3.5–5.1)
Sodium: 131 mmol/L — ABNORMAL LOW (ref 135–145)
TCO2: 21 mmol/L — ABNORMAL LOW (ref 22–32)

## 2018-10-16 LAB — SAMPLE TO BLOOD BANK

## 2018-10-16 LAB — PROTIME-INR
INR: 1 (ref 0.8–1.2)
Prothrombin Time: 12.9 seconds (ref 11.4–15.2)

## 2018-10-16 LAB — CDS SEROLOGY

## 2018-10-16 LAB — ABO/RH: ABO/RH(D): O POS

## 2018-10-16 LAB — SARS CORONAVIRUS 2 BY RT PCR (HOSPITAL ORDER, PERFORMED IN ~~LOC~~ HOSPITAL LAB): SARS Coronavirus 2: NEGATIVE

## 2018-10-16 LAB — ETHANOL: Alcohol, Ethyl (B): <10 mg/dL (ref <10)

## 2018-10-16 LAB — LACTIC ACID, PLASMA: Lactic Acid, Venous: 2.4 mmol/L (ref 0.5–1.9)

## 2018-10-16 LAB — PREPARE RBC (CROSSMATCH)

## 2018-10-16 SURGERY — LAPAROTOMY, EXPLORATORY
Anesthesia: General | Site: Abdomen

## 2018-10-16 MED ORDER — ONDANSETRON HCL 4 MG/2ML IJ SOLN
INTRAMUSCULAR | Status: DC | PRN
  Administered 2018-10-16: 4 mg via INTRAVENOUS

## 2018-10-16 MED ORDER — FENTANYL CITRATE (PF) 250 MCG/5ML IJ SOLN
INTRAMUSCULAR | Status: AC
  Filled 2018-10-16: qty 5

## 2018-10-16 MED ORDER — PANTOPRAZOLE SODIUM 40 MG PO TBEC
40.0000 mg | DELAYED_RELEASE_TABLET | Freq: Every day | ORAL | Status: DC
  Administered 2018-10-19 – 2018-10-21 (×3): 40 mg via ORAL
  Filled 2018-10-16 (×4): qty 1

## 2018-10-16 MED ORDER — DEXAMETHASONE SODIUM PHOSPHATE 10 MG/ML IJ SOLN
INTRAMUSCULAR | Status: AC
  Filled 2018-10-16: qty 1

## 2018-10-16 MED ORDER — DEXAMETHASONE SODIUM PHOSPHATE 10 MG/ML IJ SOLN
INTRAMUSCULAR | Status: DC | PRN
  Administered 2018-10-16: 4 mg via INTRAVENOUS

## 2018-10-16 MED ORDER — PANTOPRAZOLE SODIUM 40 MG IV SOLR
40.0000 mg | Freq: Every day | INTRAVENOUS | Status: DC
  Administered 2018-10-17 – 2018-10-18 (×2): 40 mg via INTRAVENOUS
  Filled 2018-10-16 (×3): qty 40

## 2018-10-16 MED ORDER — CEFAZOLIN SODIUM 1 G IJ SOLR
INTRAMUSCULAR | Status: AC
  Filled 2018-10-16: qty 20

## 2018-10-16 MED ORDER — SUCCINYLCHOLINE CHLORIDE 200 MG/10ML IV SOSY
PREFILLED_SYRINGE | INTRAVENOUS | Status: DC | PRN
  Administered 2018-10-16: 140 mg via INTRAVENOUS

## 2018-10-16 MED ORDER — FENTANYL CITRATE (PF) 250 MCG/5ML IJ SOLN
INTRAMUSCULAR | Status: DC | PRN
  Administered 2018-10-16: 125 ug via INTRAVENOUS

## 2018-10-16 MED ORDER — HYDROMORPHONE HCL 1 MG/ML IJ SOLN
INTRAMUSCULAR | Status: AC
  Filled 2018-10-16: qty 1

## 2018-10-16 MED ORDER — SUGAMMADEX SODIUM 200 MG/2ML IV SOLN
INTRAVENOUS | Status: DC | PRN
  Administered 2018-10-16: 200 mg via INTRAVENOUS

## 2018-10-16 MED ORDER — MEPERIDINE HCL 25 MG/ML IJ SOLN: 6.2500 mg | INTRAMUSCULAR | Status: DC | PRN

## 2018-10-16 MED ORDER — ONDANSETRON HCL 4 MG/2ML IJ SOLN
INTRAMUSCULAR | Status: AC
  Filled 2018-10-16: qty 2

## 2018-10-16 MED ORDER — SUCCINYLCHOLINE CHLORIDE 200 MG/10ML IV SOSY
PREFILLED_SYRINGE | INTRAVENOUS | Status: AC
  Filled 2018-10-16: qty 10

## 2018-10-16 MED ORDER — ROCURONIUM BROMIDE 10 MG/ML (PF) SYRINGE
PREFILLED_SYRINGE | INTRAVENOUS | Status: AC
  Filled 2018-10-16: qty 10

## 2018-10-16 MED ORDER — PROPOFOL 10 MG/ML IV BOLUS
INTRAVENOUS | Status: DC | PRN
  Administered 2018-10-16: 80 mg via INTRAVENOUS

## 2018-10-16 MED ORDER — 0.9 % SODIUM CHLORIDE (POUR BTL) OPTIME
TOPICAL | Status: DC | PRN
  Administered 2018-10-16: 3000 mL

## 2018-10-16 MED ORDER — SODIUM CHLORIDE 0.9 % IV SOLN
INTRAVENOUS | Status: DC | PRN
  Administered 2018-10-16: 17:00:00 via INTRAVENOUS

## 2018-10-16 MED ORDER — HYDROMORPHONE HCL 1 MG/ML IJ SOLN
0.2500 mg | INTRAMUSCULAR | Status: DC | PRN
  Administered 2018-10-16 (×3): 0.25 mg via INTRAVENOUS

## 2018-10-16 MED ORDER — ROCURONIUM BROMIDE 10 MG/ML (PF) SYRINGE
PREFILLED_SYRINGE | INTRAVENOUS | Status: DC | PRN
  Administered 2018-10-16: 30 mg via INTRAVENOUS
  Administered 2018-10-16: 10 mg via INTRAVENOUS

## 2018-10-16 MED ORDER — ONDANSETRON HCL 4 MG/2ML IJ SOLN: 4.0000 mg | Freq: Once | INTRAMUSCULAR | Status: DC | PRN

## 2018-10-16 MED ORDER — CEFAZOLIN SODIUM-DEXTROSE 2-3 GM-%(50ML) IV SOLR
INTRAVENOUS | Status: DC | PRN
  Administered 2018-10-16: 2 g via INTRAVENOUS

## 2018-10-16 MED ORDER — PROPOFOL 10 MG/ML IV BOLUS
INTRAVENOUS | Status: AC
  Filled 2018-10-16: qty 20

## 2018-10-16 MED ORDER — TETANUS-DIPHTH-ACELL PERTUSSIS 5-2.5-18.5 LF-MCG/0.5 IM SUSP
0.5000 mL | Freq: Once | INTRAMUSCULAR | Status: AC
  Administered 2018-10-16: 18:00:00 0.5 mL via INTRAMUSCULAR

## 2018-10-16 SURGICAL SUPPLY — 52 items
BLADE CLIPPER SURG (BLADE) IMPLANT
BNDG GAUZE ELAST 4 BULKY (GAUZE/BANDAGES/DRESSINGS) ×1 IMPLANT
CANISTER SUCT 3000ML PPV (MISCELLANEOUS) ×2 IMPLANT
CHLORAPREP W/TINT 26 (MISCELLANEOUS) ×3 IMPLANT
COVER SURGICAL LIGHT HANDLE (MISCELLANEOUS) ×3 IMPLANT
COVER WAND RF STERILE (DRAPES) ×3 IMPLANT
DRAPE LAPAROSCOPIC ABDOMINAL (DRAPES) ×3 IMPLANT
DRAPE WARM FLUID 44X44 (DRAPES) ×3 IMPLANT
DRSG OPSITE POSTOP 4X10 (GAUZE/BANDAGES/DRESSINGS) IMPLANT
DRSG OPSITE POSTOP 4X8 (GAUZE/BANDAGES/DRESSINGS) IMPLANT
ELECT BLADE 6.5 EXT (BLADE) IMPLANT
ELECT CAUTERY BLADE 6.4 (BLADE) ×3 IMPLANT
ELECT REM PT RETURN 9FT ADLT (ELECTROSURGICAL) ×3
ELECTRODE REM PT RTRN 9FT ADLT (ELECTROSURGICAL) ×2 IMPLANT
GAUZE SPONGE 4X4 12PLY STRL LF (GAUZE/BANDAGES/DRESSINGS) ×2 IMPLANT
GLOVE BIO SURGEON STRL SZ8 (GLOVE) ×3 IMPLANT
GLOVE BIOGEL PI IND STRL 7.0 (GLOVE) IMPLANT
GLOVE BIOGEL PI IND STRL 8 (GLOVE) ×2 IMPLANT
GLOVE BIOGEL PI INDICATOR 7.0 (GLOVE) ×3
GLOVE BIOGEL PI INDICATOR 8 (GLOVE) ×1
GLOVE SURG SS PI 6.5 STRL IVOR (GLOVE) ×1 IMPLANT
GOWN STRL REUS W/ TWL LRG LVL3 (GOWN DISPOSABLE) ×2 IMPLANT
GOWN STRL REUS W/ TWL XL LVL3 (GOWN DISPOSABLE) ×2 IMPLANT
GOWN STRL REUS W/TWL LRG LVL3 (GOWN DISPOSABLE) ×3
GOWN STRL REUS W/TWL XL LVL3 (GOWN DISPOSABLE) ×4
HANDLE SUCTION POOLE (INSTRUMENTS) ×2 IMPLANT
KIT BASIN OR (CUSTOM PROCEDURE TRAY) ×3 IMPLANT
KIT TURNOVER KIT B (KITS) ×3 IMPLANT
LIGASURE IMPACT 36 18CM CVD LR (INSTRUMENTS) ×1 IMPLANT
NS IRRIG 1000ML POUR BTL (IV SOLUTION) ×6 IMPLANT
PACK GENERAL/GYN (CUSTOM PROCEDURE TRAY) ×3 IMPLANT
PAD ABD 8X10 STRL (GAUZE/BANDAGES/DRESSINGS) ×1 IMPLANT
PAD ARMBOARD 7.5X6 YLW CONV (MISCELLANEOUS) ×3 IMPLANT
PENCIL SMOKE EVACUATOR (MISCELLANEOUS) ×3 IMPLANT
RELOAD PROXIMATE 75MM BLUE (ENDOMECHANICALS) ×6 IMPLANT
RELOAD STAPLE 75 3.8 BLU REG (ENDOMECHANICALS) IMPLANT
SPECIMEN JAR LARGE (MISCELLANEOUS) IMPLANT
SPONGE LAP 18X18 RF (DISPOSABLE) IMPLANT
STAPLER GUN LINEAR PROX 60 (STAPLE) ×1 IMPLANT
STAPLER PROXIMATE 75MM BLUE (STAPLE) ×1 IMPLANT
STAPLER VISISTAT 35W (STAPLE) ×3 IMPLANT
SUCTION POOLE HANDLE (INSTRUMENTS) ×3
SUT PDS AB 1 TP1 96 (SUTURE) ×6 IMPLANT
SUT SILK 2 0 SH CR/8 (SUTURE) ×3 IMPLANT
SUT SILK 2 0 TIES 10X30 (SUTURE) ×3 IMPLANT
SUT SILK 3 0 SH CR/8 (SUTURE) ×3 IMPLANT
SUT SILK 3 0 TIES 10X30 (SUTURE) ×3 IMPLANT
TAPE CLOTH SURG 4X10 WHT LF (GAUZE/BANDAGES/DRESSINGS) ×2 IMPLANT
TAPE CLOTH SURG 6X10 WHT LF (GAUZE/BANDAGES/DRESSINGS) ×1 IMPLANT
TOWEL GREEN STERILE (TOWEL DISPOSABLE) ×4 IMPLANT
TRAY FOLEY MTR SLVR 16FR STAT (SET/KITS/TRAYS/PACK) ×1 IMPLANT
YANKAUER SUCT BULB TIP NO VENT (SUCTIONS) IMPLANT
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
## 2018-10-16 NOTE — Anesthesia Postprocedure Evaluation (Signed)
Anesthesia Post Note  Patient: Kairee S Vane  Procedure(s) Performed: EXPLORATORY LAPAROTOMY (N/A ) Small Bowel Resection (N/A Abdomen) Small Bowel Repair (N/A Abdomen)     Patient location during evaluation: PACU Anesthesia Type: General Level of consciousness: awake and alert Pain management: pain level controlled Vital Signs Assessment: post-procedure vital signs reviewed and stable Respiratory status: spontaneous breathing, nonlabored ventilation, respiratory function stable and patient connected to nasal cannula oxygen Cardiovascular status: blood pressure returned to baseline and stable Postop Assessment: no apparent nausea or vomiting Anesthetic complications: no    Last Vitals:  Vitals:   10/16/18 2203 10/16/18 2218  BP: (!) 145/67 (!) 147/68  Pulse: 80 83  Resp: 16 19  Temp:    SpO2: 100% 100%    Last Pain:  Vitals:   10/16/18 2148  TempSrc:   PainSc: Asleep                 Nicklous Aburto COKER

## 2018-10-16 NOTE — Transfer of Care (Signed)
Immediate Anesthesia Transfer of Care Note  Patient: Rebecca Hurley  Procedure(s) Performed: EXPLORATORY LAPAROTOMY (N/A ) Small Bowel Resection (N/A Abdomen) Small Bowel Repair (N/A Abdomen)  Patient Location: PACU  Anesthesia Type:General  Level of Consciousness: drowsy and patient cooperative  Airway & Oxygen Therapy: Patient Spontanous Breathing and Patient connected to nasal cannula oxygen  Post-op Assessment: Report given to RN, Post -op Vital signs reviewed and stable and Patient moving all extremities  Post vital signs: Reviewed and stable  Last Vitals:  Vitals Value Taken Time  BP 139/60 10/16/18 1833  Temp    Pulse 75 10/16/18 1835  Resp 15 10/16/18 1835  SpO2 100 % 10/16/18 1835  Vitals shown include unvalidated device data.  Last Pain:  Vitals:   10/16/18 1653  TempSrc: Oral  PainSc: 10-Worst pain ever         Complications: No apparent anesthesia complications

## 2018-10-16 NOTE — Anesthesia Procedure Notes (Signed)
Procedure Name: Intubation Date/Time: 10/16/2018 5:25 PM Performed by: Moshe Salisbury, CRNA Pre-anesthesia Checklist: Patient identified, Emergency Drugs available, Suction available and Patient being monitored Patient Re-evaluated:Patient Re-evaluated prior to induction Oxygen Delivery Method: Circle System Utilized Preoxygenation: Pre-oxygenation with 100% oxygen Induction Type: IV induction, Rapid sequence and Cricoid Pressure applied Laryngoscope Size: Mac and 3 Tube type: Oral Tube size: 7.5 mm Number of attempts: 1 Airway Equipment and Method: Stylet Placement Confirmation: ETT inserted through vocal cords under direct vision,  positive ETCO2 and breath sounds checked- equal and bilateral Secured at: 20 cm Tube secured with: Tape Dental Injury: Teeth and Oropharynx as per pre-operative assessment

## 2018-10-16 NOTE — Anesthesia Preprocedure Evaluation (Signed)
Anesthesia Evaluation  Patient identified by MRN, date of birth, ID band Patient unresponsive  Preop documentation limited or incomplete due to emergent nature of procedure.  Airway Mallampati: I  TM Distance: >3 FB Neck ROM: Full    Dental   Pulmonary    Pulmonary exam normal        Cardiovascular hypertension, Normal cardiovascular exam     Neuro/Psych    GI/Hepatic   Endo/Other    Renal/GU      Musculoskeletal   Abdominal   Peds  Hematology   Anesthesia Other Findings   Reproductive/Obstetrics                             Anesthesia Physical Anesthesia Plan  ASA: III and emergent  Anesthesia Plan: General   Post-op Pain Management:    Induction: Intravenous  PONV Risk Score and Plan: 3 and Ondansetron and Treatment may vary due to age or medical condition  Airway Management Planned: Oral ETT  Additional Equipment:   Intra-op Plan:   Post-operative Plan: Possible Post-op intubation/ventilation  Informed Consent: I have reviewed the patients History and Physical, chart, labs and discussed the procedure including the risks, benefits and alternatives for the proposed anesthesia with the patient or authorized representative who has indicated his/her understanding and acceptance.       Plan Discussed with: CRNA and Surgeon  Anesthesia Plan Comments:         Anesthesia Quick Evaluation
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
## 2018-10-17 ENCOUNTER — Other Ambulatory Visit: Payer: Self-pay

## 2018-10-17 ENCOUNTER — Encounter (HOSPITAL_COMMUNITY): Payer: Self-pay | Admitting: *Deleted

## 2018-10-17 LAB — MRSA PCR SCREENING: MRSA by PCR: NEGATIVE

## 2018-10-17 LAB — BASIC METABOLIC PANEL
Anion gap: 7 (ref 5–15)
BUN: 11 mg/dL (ref 8–23)
CO2: 23 mmol/L (ref 22–32)
Calcium: 8 mg/dL — ABNORMAL LOW (ref 8.9–10.3)
Chloride: 105 mmol/L (ref 98–111)
Creatinine, Ser: 0.85 mg/dL (ref 0.44–1.00)
GFR calc Af Amer: >60 mL/min (ref >60)
GFR calc non Af Amer: >60 mL/min (ref >60)
Glucose, Bld: 159 mg/dL — ABNORMAL HIGH (ref 70–99)
Potassium: 4.7 mmol/L (ref 3.5–5.1)
Sodium: 135 mmol/L (ref 135–145)

## 2018-10-17 LAB — CBC
HCT: 34.3 % — ABNORMAL LOW (ref 36.0–46.0)
Hemoglobin: 11.5 g/dL — ABNORMAL LOW (ref 12.0–15.0)
MCH: 30.6 pg (ref 26.0–34.0)
MCHC: 33.5 g/dL (ref 30.0–36.0)
MCV: 91.2 fL (ref 80.0–100.0)
Platelets: 192 10*3/uL (ref 150–400)
RBC: 3.76 MIL/uL — ABNORMAL LOW (ref 3.87–5.11)
RDW: 13.4 % (ref 11.5–15.5)
WBC: 8 10*3/uL (ref 4.0–10.5)
nRBC: 0 % (ref 0.0–0.2)

## 2018-10-17 MED ORDER — HYDROMORPHONE HCL 1 MG/ML IJ SOLN
0.5000 mg | INTRAMUSCULAR | Status: DC | PRN
  Administered 2018-10-19 – 2018-10-21 (×6): 0.5 mg via INTRAVENOUS
  Filled 2018-10-17 (×7): qty 1

## 2018-10-17 MED ORDER — HYDROMORPHONE HCL 1 MG/ML IJ SOLN
1.0000 mg | INTRAMUSCULAR | Status: DC | PRN
  Administered 2018-10-17 – 2018-10-20 (×10): 1 mg via INTRAVENOUS
  Filled 2018-10-17 (×11): qty 1

## 2018-10-17 MED ORDER — CHLORHEXIDINE GLUCONATE CLOTH 2 % EX PADS
6.0000 | MEDICATED_PAD | Freq: Every day | CUTANEOUS | Status: DC
  Administered 2018-10-18: 6 via TOPICAL

## 2018-10-17 MED ORDER — POTASSIUM CHLORIDE IN NACL 20-0.9 MEQ/L-% IV SOLN
INTRAVENOUS | Status: DC
  Administered 2018-10-17 – 2018-10-21 (×7): via INTRAVENOUS
  Filled 2018-10-17 (×9): qty 1000

## 2018-10-17 MED ORDER — ACETAMINOPHEN 10 MG/ML IV SOLN
1000.0000 mg | Freq: Four times a day (QID) | INTRAVENOUS | Status: DC
  Filled 2018-10-17: qty 100

## 2018-10-17 MED ORDER — ONDANSETRON 4 MG PO TBDP
4.0000 mg | ORAL_TABLET | Freq: Four times a day (QID) | ORAL | Status: DC | PRN
  Administered 2018-10-22: 4 mg via ORAL
  Filled 2018-10-17: qty 1

## 2018-10-17 MED ORDER — MORPHINE SULFATE (PF) 2 MG/ML IV SOLN: 2.0000 mg | INTRAVENOUS | Status: DC | PRN

## 2018-10-17 MED ORDER — ONDANSETRON HCL 4 MG/2ML IJ SOLN
4.0000 mg | Freq: Four times a day (QID) | INTRAMUSCULAR | Status: DC | PRN
  Administered 2018-10-17 – 2018-10-18 (×2): 4 mg via INTRAVENOUS
  Filled 2018-10-17 (×2): qty 2

## 2018-10-17 MED ORDER — METHOCARBAMOL 1000 MG/10ML IJ SOLN
500.0000 mg | Freq: Three times a day (TID) | INTRAVENOUS | Status: DC
  Administered 2018-10-17 – 2018-10-18 (×4): 500 mg via INTRAVENOUS
  Filled 2018-10-17 (×11): qty 5

## 2018-10-17 MED ORDER — METOPROLOL TARTRATE 5 MG/5ML IV SOLN: 5.0000 mg | Freq: Four times a day (QID) | INTRAVENOUS | Status: DC | PRN
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
## 2018-10-17 NOTE — Progress Notes (Signed)
Inpatient Rehabilitation Admissions Coordinator  Inpatient rehab consult received. NGT. I will follow up on Monday to see how she has progressed over the weekend to complete rehab consult.  Danne Baxter, RN, MSN Rehab Admissions Coordinator [phone] 10/17/2018 2:31 PM
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
## 2018-10-17 NOTE — Progress Notes (Signed)
Rehab Admissions Coordinator Note:  Per PT recommendation, this patient was screened by Raechel Ache for appropriateness for an Inpatient Acute Rehab Consult.  At this time, we are recommending Inpatient Rehab consult. AC will contact MD to request order.   Raechel Ache 10/17/2018, 1:33 PM  I can be reached at [phone].
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
## 2018-10-18 NOTE — Progress Notes (Signed)
OT Cancellation Note  Patient Details Name: Rebecca Hurley MRN: [id number] DOB: 02-01-1940   Cancelled Treatment:    Reason Eval/Treat Not Completed: Patient declined, no reason specified;Other (comment)(Pt reported that she was experiencing nausea, had recently received a bath, was up to the bathroom, and completed exercises with PT. Pt requests we come back tomorrow.)  Gus Rankin, OT Student  Gus Rankin 10/18/2018, 3:58 PM
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
## 2018-10-20 LAB — BPAM RBC
Blood Product Expiration Date: 202011072359
Blood Product Expiration Date: 202011092359
Blood Product Expiration Date: 202011092359
Blood Product Expiration Date: 202011092359
Blood Product Expiration Date: 202011092359
Blood Product Expiration Date: 202011102359
ISSUE DATE / TIME: 202010081743
ISSUE DATE / TIME: 202010081743
ISSUE DATE / TIME: 202010092154
ISSUE DATE / TIME: 202010100751
Unit Type and Rh: 5100
Unit Type and Rh: 5100
Unit Type and Rh: 5100
Unit Type and Rh: 5100
Unit Type and Rh: 5100
Unit Type and Rh: 5100

## 2018-10-20 LAB — TYPE AND SCREEN
ABO/RH(D): O POS
Antibody Screen: NEGATIVE
Unit division: 0
Unit division: 0
Unit division: 0
Unit division: 0
Unit division: 0
Unit division: 0

## 2018-10-20 LAB — BASIC METABOLIC PANEL
Anion gap: 9 (ref 5–15)
BUN: 6 mg/dL — ABNORMAL LOW (ref 8–23)
CO2: 23 mmol/L (ref 22–32)
Calcium: 7.8 mg/dL — ABNORMAL LOW (ref 8.9–10.3)
Chloride: 102 mmol/L (ref 98–111)
Creatinine, Ser: 0.33 mg/dL — ABNORMAL LOW (ref 0.44–1.00)
GFR calc Af Amer: >60 mL/min (ref >60)
GFR calc non Af Amer: >60 mL/min (ref >60)
Glucose, Bld: 109 mg/dL — ABNORMAL HIGH (ref 70–99)
Potassium: 3.8 mmol/L (ref 3.5–5.1)
Sodium: 134 mmol/L — ABNORMAL LOW (ref 135–145)

## 2018-10-20 LAB — CBC
HCT: 28.1 % — ABNORMAL LOW (ref 36.0–46.0)
Hemoglobin: 9.5 g/dL — ABNORMAL LOW (ref 12.0–15.0)
MCH: 31.3 pg (ref 26.0–34.0)
MCHC: 33.8 g/dL (ref 30.0–36.0)
MCV: 92.4 fL (ref 80.0–100.0)
Platelets: 187 10*3/uL (ref 150–400)
RBC: 3.04 MIL/uL — ABNORMAL LOW (ref 3.87–5.11)
RDW: 13.6 % (ref 11.5–15.5)
WBC: 7.2 10*3/uL (ref 4.0–10.5)
nRBC: 0.3 % — ABNORMAL HIGH (ref 0.0–0.2)

## 2018-10-20 LAB — SURGICAL PATHOLOGY

## 2018-10-20 MED ORDER — TRAMADOL HCL 50 MG PO TABS
25.0000 mg | ORAL_TABLET | Freq: Four times a day (QID) | ORAL | Status: DC | PRN
  Filled 2018-10-20: qty 1

## 2018-10-20 MED ORDER — ENOXAPARIN SODIUM 40 MG/0.4ML ~~LOC~~ SOLN
40.0000 mg | SUBCUTANEOUS | Status: DC
  Administered 2018-10-20 – 2018-10-22 (×3): 40 mg via SUBCUTANEOUS
  Filled 2018-10-20 (×4): qty 0.4

## 2018-10-20 MED ORDER — KETOROLAC TROMETHAMINE 15 MG/ML IJ SOLN: 15.0000 mg | Freq: Four times a day (QID) | INTRAMUSCULAR | Status: DC | PRN
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
## 2018-10-21 LAB — BASIC METABOLIC PANEL
Anion gap: 8 (ref 5–15)
BUN: <5 mg/dL — ABNORMAL LOW (ref 8–23)
CO2: 26 mmol/L (ref 22–32)
Calcium: 8 mg/dL — ABNORMAL LOW (ref 8.9–10.3)
Chloride: 102 mmol/L (ref 98–111)
Creatinine, Ser: 0.5 mg/dL (ref 0.44–1.00)
GFR calc Af Amer: >60 mL/min (ref >60)
GFR calc non Af Amer: >60 mL/min (ref >60)
Glucose, Bld: 100 mg/dL — ABNORMAL HIGH (ref 70–99)
Potassium: 3.9 mmol/L (ref 3.5–5.1)
Sodium: 136 mmol/L (ref 135–145)

## 2018-10-21 MED ORDER — HYDROMORPHONE HCL 1 MG/ML IJ SOLN: 0.5000 mg | INTRAMUSCULAR | Status: DC | PRN

## 2018-10-21 MED ORDER — HYDROMORPHONE HCL 1 MG/ML IJ SOLN: 0.5000 mg | Freq: Four times a day (QID) | INTRAMUSCULAR | Status: DC | PRN

## 2018-10-21 MED ORDER — OXYCODONE HCL 5 MG PO TABS: 2.5000 mg | ORAL_TABLET | ORAL | Status: DC | PRN

## 2018-10-21 MED ORDER — OXYCODONE HCL 5 MG PO TABS: 5.0000 mg | ORAL_TABLET | ORAL | Status: DC | PRN

## 2018-10-21 MED ORDER — IRBESARTAN 75 MG PO TABS: 75.0000 mg | ORAL_TABLET | Freq: Every day | ORAL | Status: DC

## 2018-10-21 MED ORDER — TRAMADOL HCL 50 MG PO TABS
50.0000 mg | ORAL_TABLET | ORAL | Status: DC | PRN
  Administered 2018-10-21 – 2018-10-22 (×3): 50 mg via ORAL
  Filled 2018-10-21 (×4): qty 1

## 2018-10-21 MED ORDER — BUSPIRONE HCL 5 MG PO TABS
10.0000 mg | ORAL_TABLET | Freq: Once | ORAL | Status: AC
  Administered 2018-10-21: 10 mg via ORAL
  Filled 2018-10-21: qty 2

## 2018-10-21 MED ORDER — KETOROLAC TROMETHAMINE 15 MG/ML IJ SOLN: 15.0000 mg | Freq: Four times a day (QID) | INTRAMUSCULAR | Status: DC | PRN

## 2018-10-21 MED ORDER — TRAMADOL HCL 50 MG PO TABS: 25.0000 mg | ORAL_TABLET | ORAL | Status: DC | PRN

## 2018-10-21 MED ORDER — ENSURE ENLIVE PO LIQD
237.0000 mL | Freq: Two times a day (BID) | ORAL | Status: DC
  Administered 2018-10-21 – 2018-10-23 (×4): 237 mL via ORAL

## 2018-10-21 MED ORDER — IRBESARTAN 75 MG PO TABS
75.0000 mg | ORAL_TABLET | Freq: Two times a day (BID) | ORAL | Status: DC
  Administered 2018-10-21 – 2018-10-23 (×5): 75 mg via ORAL
  Filled 2018-10-21 (×6): qty 1

## 2018-10-21 NOTE — TOC Progression Note (Addendum)
Transition of Care [facility]) - Progression Note    Patient Details  Name: Rebecca Hurley MRN: [id number] Date of Birth: 05-31-1940  [facility] CM/SW Contact  Ella Bodo, RN Phone Number: 10/21/2018, 2:00 PM  Clinical Narrative: Phoebe Perch completed and faxed out for potential SNF bed offers, as backup plan for CIR.  Insurance auth pending for [employer]; will follow.    Addendum:  2820  Met with pt and spouse to discharge planning.  They now prefer SNF over CIR.  Reviewed bed offers; they prefer [facility].  Will call to accept bed in AM, and initiate insurance auth, as it is currently after business hours. Will follow with updates as available.   ###Need repeat COVID test (pref. quick turnaround), now that patient is discharging to SNF.  Expected Discharge Plan: IP Rehab Facility Barriers to Discharge: Continued Medical Work up  Expected Discharge Plan and Services Expected Discharge Plan: [place]   Discharge Planning Services: CM Consult   Living arrangements for the past 2 months: [place], RN, BSN  Trauma/Neuro ICU Case Manager [phone]

## 2018-10-21 NOTE — Progress Notes (Signed)
Inpatient Rehabilitation Admissions Coordinator  Noted pt and spouse prefer SNF. We will sign off at this time.  Danne Baxter, RN, MSN Rehab Admissions Coordinator [phone] 10/21/2018 6:43 PM
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
## 2018-10-21 NOTE — Discharge Instructions (Signed)
MIDLINE WOUND CARE: °- midline dressing to be changed twice daily °- supplies: sterile saline, kerlix, scissors, ABD pads, tape  °- remove dressing and all packing carefully, moistening with sterile saline as needed to avoid packing/internal dressing sticking to the wound. °- clean edges of skin around the wound with water/gauze, making sure there is no tape debris or leakage left on skin that could cause skin irritation or breakdown. °- dampen and clean kerlix with sterile saline and pack wound from wound base to skin level, making sure to take note of any possible areas of wound tracking, tunneling and packing appropriately. Wound can be packed loosely. Trim kerlix to size if a whole kerlix is not required. °- cover wound with a dry ABD pad and secure with tape.  °- write the date/time on the dry dressing/tape to better track when the last dressing change occurred. °- apply any skin protectant/powder recommended by clinician to protect skin/skin folds. °- change dressing as needed if leakage occurs, wound gets contaminated, or patient requests to shower. °- patient may shower daily with wound open and following the shower the wound should be dried and a clean dressing placed.  ° °[facility]      [facility] °[phone] ° °OPEN ABDOMINAL SURGERY: POST OP INSTRUCTIONS ° °Always review your discharge instruction sheet given to you by the facility where your surgery was performed. ° °IF YOU HAVE DISABILITY OR FAMILY LEAVE FORMS, YOU MUST BRING THEM TO THE OFFICE FOR PROCESSING.  PLEASE DO NOT GIVE THEM TO YOUR DOCTOR. ° °1. A prescription for pain medication may be given to you upon discharge.  Take your pain medication as prescribed, if needed.  If narcotic pain medicine is not needed, then you may take acetaminophen (Tylenol) or ibuprofen (Advil) as needed. °2. Take your usually prescribed medications unless otherwise directed. °3. If you need a refill on your pain medication, please contact your  pharmacy. They will contact our office to request authorization.  Prescriptions will not be filled after 5pm or on week-ends. °4. You should follow a light diet the first few days after arrival home, such as soup and crackers, pudding, etc.unless your doctor has advised otherwise. A high-fiber, low fat diet can be resumed as tolerated.   Be sure to include lots of fluids daily. Most patients will experience some swelling and bruising on the chest and neck area.  Ice packs will help.  Swelling and bruising can take several days to resolve °5. Most patients will experience some swelling and bruising in the area of the incision. Ice pack will help. Swelling and bruising can take several days to resolve..  °6. It is common to experience some constipation if taking pain medication after surgery.  Increasing fluid intake and taking a stool softener will usually help or prevent this problem from occurring.  A mild laxative (Milk of Magnesia or Miralax) should be taken according to package directions if there are no bowel movements after 48 hours. °7.  You may have steri-strips (small skin tapes) in place directly over the incision.  These strips should be left on the skin for 7-10 days.  If your surgeon used skin glue on the incision, you may shower in 24 hours.  The glue will flake off over the next 2-3 weeks.  Any sutures or staples will be removed at the office during your follow-up visit. You may find that a light gauze bandage over your incision may keep your staples from being rubbed or pulled.   You may shower and replace the bandage daily. °8. ACTIVITIES:  You may resume regular (light) daily activities beginning the next day--such as daily self-care, walking, climbing stairs--gradually increasing activities as tolerated.  You may have sexual intercourse when it is comfortable.  Refrain from any heavy lifting or straining until approved by your doctor. °a. You may drive when you no longer are taking prescription pain  medication, you can comfortably wear a seatbelt, and you can safely maneuver your car and apply brakes °b. Return to Work: ___________________________________ °9. You should see your doctor in the office for a follow-up appointment approximately two weeks after your surgery.  Make sure that you call for this appointment within a day or two after you arrive home to insure a convenient appointment time. °OTHER INSTRUCTIONS:  °_____________________________________________________________ °_____________________________________________________________ ° °WHEN TO CALL YOUR DOCTOR: °1. Fever over 101.0 °2. Inability to urinate °3. Nausea and/or vomiting °4. Extreme swelling or bruising °5. Continued bleeding from incision. °6. Increased pain, redness, or drainage from the incision. °7. Difficulty swallowing or breathing °8. Muscle cramping or spasms. °9. Numbness or tingling in hands or feet or around lips. ° °The clinic staff is available to answer your questions during regular business hours.  Please don’t hesitate to call and ask to speak to one of the nurses if you have concerns. ° °For further questions, please visit [email or web address] ° ° ° °

## 2018-10-21 NOTE — Progress Notes (Signed)
Inpatient Rehabilitation Admissions Coordinator  I await decision with insurance on a possible inpt rehab admit. I met with patient at bedside and she is asking what would be her choices for SNF rehab facilities. I have alerted RN CM , Almyra Free.  Danne Baxter, RN, MSN Rehab Admissions Coordinator [phone] 10/21/2018 12:57 PM

## 2018-10-22 LAB — NOVEL CORONAVIRUS, NAA (HOSP ORDER, SEND-OUT TO REF LAB; TAT 18-24 HRS): SARS-CoV-2, NAA: NOT DETECTED

## 2018-10-22 LAB — CBC
HCT: 27.9 % — ABNORMAL LOW (ref 36.0–46.0)
Hemoglobin: 9.4 g/dL — ABNORMAL LOW (ref 12.0–15.0)
MCH: 30.5 pg (ref 26.0–34.0)
MCHC: 33.7 g/dL (ref 30.0–36.0)
MCV: 90.6 fL (ref 80.0–100.0)
Platelets: 236 10*3/uL (ref 150–400)
RBC: 3.08 MIL/uL — ABNORMAL LOW (ref 3.87–5.11)
RDW: 13.4 % (ref 11.5–15.5)
WBC: 6.7 10*3/uL (ref 4.0–10.5)
nRBC: 0.3 % — ABNORMAL HIGH (ref 0.0–0.2)

## 2018-10-22 MED ORDER — POLYETHYLENE GLYCOL 3350 17 G PO PACK
17.0000 g | PACK | Freq: Every day | ORAL | Status: DC | PRN
  Administered 2018-10-22: 17 g via ORAL
  Filled 2018-10-22: qty 1

## 2018-10-22 MED ORDER — BISACODYL 5 MG PO TBEC: 5.0000 mg | DELAYED_RELEASE_TABLET | Freq: Every day | ORAL | Status: DC | PRN

## 2018-10-22 MED ORDER — KETOROLAC TROMETHAMINE 15 MG/ML IJ SOLN: 15.0000 mg | Freq: Four times a day (QID) | INTRAMUSCULAR | Status: DC | PRN

## 2018-10-22 MED ORDER — DOCUSATE SODIUM 100 MG PO CAPS: 100.0000 mg | ORAL_CAPSULE | Freq: Two times a day (BID) | ORAL | Status: DC

## 2018-10-22 MED ORDER — SIMETHICONE 80 MG PO CHEW: 80.0000 mg | CHEWABLE_TABLET | Freq: Four times a day (QID) | ORAL | Status: DC | PRN

## 2018-10-22 NOTE — TOC Progression Note (Signed)
Transition of Care [facility]) - Progression Note    Patient Details  Name: Rebecca Hurley MRN: [id number] Date of Birth: Jun 21, 1940  Transition of Care [facility]) CM/SW Contact  Ella Bodo, RN Phone Number: 10/22/2018, 1:52 PM  Clinical Narrative:   Insurance authorization has been initiated with [facility] for admission to [place].  COVID test pending; await results.  Will follow with updates as available.      Expected Discharge Plan:  Skilled Nursing Facility Barriers to Discharge: Insurance Authorization  Expected Discharge Plan and Services Expected Discharge Plan: [place]   Discharge Planning Services: CM Consult   Living arrangements for the past 2 months: [place], RN, BSN  Trauma/Neuro ICU Case Manager [phone]

## 2018-10-22 NOTE — Progress Notes (Signed)
Physical Therapy Note  SATURATION QUALIFICATIONS: (This note is used to comply with regulatory documentation for home oxygen)  Patient Saturations on Room Air at Rest = 95%  Patient Saturations on Room Air while Ambulating = 85%  Patient Saturations on 2 Liters of oxygen while Ambulating = 94%  Please briefly explain why patient needs home oxygen: Patient requires supplemental oxygen to maintain oxygen saturations at acceptable, safe levels with physical activity.  See also other PT notes of this date for other details of session.  Roney Marion, [place]  Acute Rehabilitation Services Pager [phone] Office [phone]
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
## 2018-10-22 NOTE — Progress Notes (Signed)
Patient refused to take Avapro tonight at 2200. She is alert and oriented x4 and was educated on the importance of the medication for her blood pressure. She still refused. Will continue to monitor patient and her BP.
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
## 2018-10-23 MED ORDER — BUSPIRONE HCL 5 MG PO TABS
5.0000 mg | ORAL_TABLET | Freq: Once | ORAL | Status: DC
  Filled 2018-10-23: qty 1

## 2018-10-23 MED ORDER — ENSURE ENLIVE PO LIQD: 237.0000 mL | Freq: Two times a day (BID) | ORAL | 12 refills | Status: DC

## 2018-10-23 MED ORDER — SIMETHICONE 80 MG PO CHEW: 80.0000 mg | CHEWABLE_TABLET | Freq: Four times a day (QID) | ORAL | 0 refills | Status: DC | PRN

## 2018-10-23 MED ORDER — POLYETHYLENE GLYCOL 3350 17 G PO PACK: 17.0000 g | PACK | Freq: Every day | ORAL | 0 refills | Status: DC | PRN

## 2018-10-23 MED ORDER — TRAMADOL HCL 50 MG PO TABS: 50.0000 mg | ORAL_TABLET | Freq: Four times a day (QID) | ORAL | 0 refills | Status: DC | PRN

## 2018-10-23 NOTE — Plan of Care (Signed)

## 2018-10-23 NOTE — Progress Notes (Signed)
Inpatient Rehabilitation Admissions Coordinator  Pt's spouse cell number is [phone].  Danne Baxter, RN, MSN Rehab Admissions Coordinator [phone] 10/23/2018 9:57 AM

## 2018-10-23 NOTE — Care Management Important Message (Signed)
Important Message  Patient Details  Name: Rebecca Hurley MRN: [id number] Date of Birth: 1940-04-17   Medicare Important Message Given:  Yes     Memory Argue 10/23/2018, 11:37 AM

## 2018-10-23 NOTE — TOC Transition Note (Addendum)
Transition of Care [facility]) - CM/SW Discharge Note   Patient Details  Name: Rebecca Hurley MRN: [id number] Date of Birth: October 05, 1940  Transition of Care [facility]) CM/SW Contact:  Ella Bodo, RN Phone Number: 10/23/2018, 10:11 AM   Clinical Narrative:   Received authorization from [facility] for SNF admission Ref# [id number] Auth# [id number] Approved for 3 day stay, starting on 10/22/18 Case Manager Larene Beach, [phone]  Pt discharging to [name], Room # 303, per Levada Dy.  Bedside Nurse to call report to [phone]. Will arrange transport per PTAR.  PTAR called for transport at 1055 AM.    Final next level of care: [place] Barriers to Discharge: Barriers Resolved   Patient Goals and CMS Choice Patient states their goals for this hospitalization and ongoing recovery are:: To get better CMS Medicare.gov Compare Post Acute Care list provided to:: Patient Choice offered to / list presented to : Patient, Spouse  Discharge Placement PASRR number recieved: 10/21/18            Patient chooses bed at: [place] [place] Patient to be transferred to facility by: [place] Name of family member notified: [name], husband Patient and family notified of of transfer: 10/23/18  Discharge Plan and Services   Discharge Planning Services: CM Consult Post Acute Care Choice: [place]                                Readmission Risk Interventions Readmission Risk Prevention Plan 10/23/2018  Post Dischage Appt Not Complete  Appt Comments Pt discharging to SNF  Medication Screening Complete  Transportation Screening Complete   Reinaldo Raddle, RN, BSN  Trauma/Neuro ICU Case Manager [phone]

## 2018-10-24 ENCOUNTER — Encounter (HOSPITAL_COMMUNITY): Payer: Self-pay

## 2019-02-07 ENCOUNTER — Ambulatory Visit: Payer: Medicare Other

## 2019-02-12 ENCOUNTER — Ambulatory Visit: Payer: Medicare Other

## 2019-12-15 ENCOUNTER — Encounter (HOSPITAL_COMMUNITY): Payer: Self-pay | Admitting: Emergency Medicine

## 2019-12-15 ENCOUNTER — Emergency Department (HOSPITAL_COMMUNITY): Payer: Medicare Other

## 2019-12-15 ENCOUNTER — Emergency Department (HOSPITAL_COMMUNITY)
Admission: EM | Admit: 2019-12-15 | Discharge: 2019-12-15 | Disposition: A | Discharge status: Home / Self Care | Payer: Medicare Other | Attending: Emergency Medicine | Admitting: Emergency Medicine

## 2019-12-15 ENCOUNTER — Other Ambulatory Visit: Payer: Self-pay

## 2019-12-15 DIAGNOSIS — R202 Paresthesia of skin: Secondary | ICD-10-CM | POA: Diagnosis present

## 2019-12-15 DIAGNOSIS — I10 Essential (primary) hypertension: Secondary | ICD-10-CM | POA: Diagnosis not present

## 2019-12-15 DIAGNOSIS — Z79899 Other long term (current) drug therapy: Secondary | ICD-10-CM | POA: Insufficient documentation

## 2019-12-15 DIAGNOSIS — R200 Anesthesia of skin: Secondary | ICD-10-CM

## 2019-12-15 DIAGNOSIS — Z7982 Long term (current) use of aspirin: Secondary | ICD-10-CM | POA: Diagnosis not present

## 2019-12-15 DIAGNOSIS — R319 Hematuria, unspecified: Secondary | ICD-10-CM | POA: Insufficient documentation

## 2019-12-15 HISTORY — DX: Essential (primary) hypertension: I10

## 2019-12-15 LAB — COMPREHENSIVE METABOLIC PANEL
ALT: 16 U/L (ref 0–44)
AST: 28 U/L (ref 15–41)
Albumin: 4 g/dL (ref 3.5–5.0)
Alkaline Phosphatase: 67 U/L (ref 38–126)
Anion gap: 9 (ref 5–15)
BUN: 15 mg/dL (ref 8–23)
CO2: 27 mmol/L (ref 22–32)
Calcium: 9.9 mg/dL (ref 8.9–10.3)
Chloride: 100 mmol/L (ref 98–111)
Creatinine, Ser: 0.74 mg/dL (ref 0.44–1.00)
GFR, Estimated: >60 mL/min (ref >60)
Glucose, Bld: 101 mg/dL — ABNORMAL HIGH (ref 70–99)
Potassium: 4.6 mmol/L (ref 3.5–5.1)
Sodium: 136 mmol/L (ref 135–145)
Total Bilirubin: 0.5 mg/dL (ref 0.3–1.2)
Total Protein: 6.7 g/dL (ref 6.5–8.1)

## 2019-12-15 LAB — CBC
HCT: 42.9 % (ref 36.0–46.0)
Hemoglobin: 13.9 g/dL (ref 12.0–15.0)
MCH: 30.5 pg (ref 26.0–34.0)
MCHC: 32.4 g/dL (ref 30.0–36.0)
MCV: 94.1 fL (ref 80.0–100.0)
Platelets: 252 10*3/uL (ref 150–400)
RBC: 4.56 MIL/uL (ref 3.87–5.11)
RDW: 13.1 % (ref 11.5–15.5)
WBC: 6.2 10*3/uL (ref 4.0–10.5)
nRBC: 0 % (ref 0.0–0.2)

## 2019-12-15 LAB — URINALYSIS, ROUTINE W REFLEX MICROSCOPIC
Bacteria, UA: NONE SEEN
Bilirubin Urine: NEGATIVE
Glucose, UA: NEGATIVE mg/dL
Ketones, ur: NEGATIVE mg/dL
Nitrite: NEGATIVE
Protein, ur: NEGATIVE mg/dL
Specific Gravity, Urine: 1.009 (ref 1.005–1.030)
pH: 5 (ref 5.0–8.0)

## 2019-12-15 LAB — TROPONIN I (HIGH SENSITIVITY): Troponin I (High Sensitivity): 7 ng/L (ref <18)

## 2019-12-15 NOTE — ED Notes (Signed)
Claudette Stapler, [place] aware that pt has refused MRI.
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
## 2019-12-15 NOTE — ED Notes (Signed)
Pt. Made aware for the need of urine specimen. 

## 2019-12-15 NOTE — ED Triage Notes (Signed)
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 Speech, gait and cognition intact. Feeling and strength intact in all limbs.

## 2019-12-17 LAB — URINE CULTURE: Culture: <10000 — AB

## 2020-01-14 DIAGNOSIS — R0609 Other forms of dyspnea: Secondary | ICD-10-CM | POA: Insufficient documentation

## 2020-01-27 ENCOUNTER — Encounter: Payer: Self-pay | Admitting: Pulmonary Disease

## 2020-01-27 ENCOUNTER — Ambulatory Visit (INDEPENDENT_AMBULATORY_CARE_PROVIDER_SITE_OTHER): Payer: Medicare Other | Admitting: Pulmonary Disease

## 2020-01-27 ENCOUNTER — Other Ambulatory Visit: Payer: Self-pay

## 2020-01-27 VITALS — BP 128/70 | HR 68 | Temp 97.3°F | Ht 60.5 in | Wt 120.6 lb

## 2020-01-27 DIAGNOSIS — J8410 Pulmonary fibrosis, unspecified: Secondary | ICD-10-CM | POA: Diagnosis not present

## 2020-01-27 DIAGNOSIS — R0609 Other forms of dyspnea: Secondary | ICD-10-CM

## 2020-01-27 DIAGNOSIS — R0600 Dyspnea, unspecified: Secondary | ICD-10-CM | POA: Diagnosis not present

## 2020-01-27 MED ORDER — BREO ELLIPTA 100-25 MCG/INH IN AEPB: 1.0000 | INHALATION_SPRAY | Freq: Every day | RESPIRATORY_TRACT | 1 refills | Status: DC
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
## 2020-01-27 NOTE — Patient Instructions (Addendum)
Nice to meet you  Your prior chest xray and allergies make me think of asthma  Use Breo 1 puff daily to see if this helps your shortness of breath  I ordered a CT scan to be done in the coming days.  I will see you back next week

## 2020-01-28 ENCOUNTER — Ambulatory Visit (INDEPENDENT_AMBULATORY_CARE_PROVIDER_SITE_OTHER)
Admission: RE | Admit: 2020-01-28 | Discharge: 2020-01-28 | Disposition: A | Discharge status: Home / Self Care | Payer: Medicare Other | Source: Ambulatory Visit | Attending: Pulmonary Disease | Admitting: Pulmonary Disease

## 2020-01-28 DIAGNOSIS — R0600 Dyspnea, unspecified: Secondary | ICD-10-CM

## 2020-01-28 DIAGNOSIS — R0609 Other forms of dyspnea: Secondary | ICD-10-CM

## 2020-01-29 ENCOUNTER — Inpatient Hospital Stay: Admission: RE | Admit: 2020-01-29 | Payer: Medicare Other | Source: Ambulatory Visit

## 2020-01-29 ENCOUNTER — Institutional Professional Consult (permissible substitution): Payer: Medicare Other | Admitting: Pulmonary Disease

## 2020-02-02 ENCOUNTER — Ambulatory Visit (INDEPENDENT_AMBULATORY_CARE_PROVIDER_SITE_OTHER): Payer: Medicare Other | Admitting: Pulmonary Disease

## 2020-02-02 ENCOUNTER — Encounter: Payer: Self-pay | Admitting: Pulmonary Disease

## 2020-02-02 ENCOUNTER — Other Ambulatory Visit: Payer: Self-pay

## 2020-02-02 VITALS — BP 108/68 | HR 51 | Temp 97.0°F | Ht 60.0 in | Wt 122.0 lb

## 2020-02-02 DIAGNOSIS — R0600 Dyspnea, unspecified: Secondary | ICD-10-CM | POA: Diagnosis not present

## 2020-02-02 DIAGNOSIS — R0609 Other forms of dyspnea: Secondary | ICD-10-CM

## 2020-02-02 DIAGNOSIS — R911 Solitary pulmonary nodule: Secondary | ICD-10-CM

## 2020-02-02 MED ORDER — IPRATROPIUM-ALBUTEROL 0.5-2.5 (3) MG/3ML IN SOLN: 3.0000 mL | Freq: Two times a day (BID) | RESPIRATORY_TRACT | 11 refills | Status: DC
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
## 2020-02-02 NOTE — Patient Instructions (Signed)
Nice to see you again  There is old scar at the top of both lungs - these are likely from old infections or exposure to something in the environment. Since a doctor told you about an abnormal xray when you were young, I suspect they are from old infection.  There is a small nodule on the left lung that we need to keep an eye on. I will get a repeat CT scan in 3 months.  Use the nebulizer machine with medicine twice a day to see if this helps with your shortness of breath.   Come back in 3 months with Dr. Judeth Horn to discuss CT scan and how breathing is doing.  Schedule follow up within week of when CT is scheduled

## 2020-02-03 ENCOUNTER — Ambulatory Visit: Payer: Medicare Other | Admitting: Pulmonary Disease

## 2020-02-04 ENCOUNTER — Telehealth: Payer: Self-pay | Admitting: Pulmonary Disease

## 2020-02-04 DIAGNOSIS — R0609 Other forms of dyspnea: Secondary | ICD-10-CM

## 2020-02-04 DIAGNOSIS — J8410 Pulmonary fibrosis, unspecified: Secondary | ICD-10-CM

## 2020-02-04 DIAGNOSIS — R0600 Dyspnea, unspecified: Secondary | ICD-10-CM

## 2020-02-04 DIAGNOSIS — R911 Solitary pulmonary nodule: Secondary | ICD-10-CM

## 2020-02-04 NOTE — Telephone Encounter (Signed)
Due to Dr. Judeth Horn being out of the office, checked with Dr. Francine Graven to see if he would be okay with [place] placing the nebulizer machine Rx under him since pt is wanting to purchase one herself instead of renting it. Dr. Francine Graven was fine with signing Rx. Rx has been printed and was given to Dr. Francine Graven for him to sign and this was given to pt's daughter-n-law Fleet Contras. Nothing further needed.

## 2020-05-02 ENCOUNTER — Inpatient Hospital Stay: Admission: RE | Admit: 2020-05-02 | Payer: Medicare Other | Source: Ambulatory Visit

## 2020-05-06 ENCOUNTER — Ambulatory Visit: Payer: Medicare Other | Admitting: Pulmonary Disease

## 2020-08-01 DIAGNOSIS — M47812 Spondylosis without myelopathy or radiculopathy, cervical region: Secondary | ICD-10-CM | POA: Insufficient documentation

## 2020-09-09 ENCOUNTER — Telehealth: Payer: Self-pay | Admitting: Pulmonary Disease

## 2020-09-09 NOTE — Telephone Encounter (Signed)
Transferred to Taryn to schedule

## 2020-09-20 ENCOUNTER — Inpatient Hospital Stay: Admission: RE | Admit: 2020-09-20 | Payer: Medicare Other | Source: Ambulatory Visit

## 2020-10-06 ENCOUNTER — Ambulatory Visit: Payer: Medicare Other | Admitting: Pulmonary Disease

## 2021-06-18 ENCOUNTER — Ambulatory Visit
Admission: EM | Admit: 2021-06-18 | Discharge: 2021-06-18 | Disposition: A | Discharge status: Home / Self Care | Payer: Medicare Other | Attending: Emergency Medicine | Admitting: Emergency Medicine

## 2021-06-18 ENCOUNTER — Encounter: Payer: Self-pay | Admitting: Emergency Medicine

## 2021-06-18 ENCOUNTER — Other Ambulatory Visit: Payer: Self-pay

## 2021-06-18 DIAGNOSIS — W57XXXA Bitten or stung by nonvenomous insect and other nonvenomous arthropods, initial encounter: Secondary | ICD-10-CM

## 2021-06-18 DIAGNOSIS — S20469A Insect bite (nonvenomous) of unspecified back wall of thorax, initial encounter: Secondary | ICD-10-CM | POA: Diagnosis not present

## 2021-06-18 MED ORDER — DOXYCYCLINE HYCLATE 100 MG PO TABS
200.0000 mg | ORAL_TABLET | Freq: Once | ORAL | Status: AC
  Administered 2021-06-18: 200 mg via ORAL

## 2021-06-18 NOTE — ED Triage Notes (Signed)
Pt here for rash around tick bite to back; tick removed 2 days ago
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
## 2021-06-18 NOTE — Discharge Instructions (Signed)
Because the tick that your son found he was not engorged, I do not believe that infection with Lyme disease or [facility] spotted fever is likely at this time.  To be on the safe side however you were provided with a single dose of doxycycline 200 mg for prevention.  Please continue to monitor the lesion for any signs of increased swelling, redness or drainage.  Please monitor the rest of your skin for rash.  Please monitor for fever, body aches, neck stiffness, vision changes.  Please return for repeat evaluation if these occur.  Thank you for visiting urgent care today.

## 2021-07-21 DIAGNOSIS — F419 Anxiety disorder, unspecified: Secondary | ICD-10-CM

## 2021-08-10 ENCOUNTER — Other Ambulatory Visit: Payer: Self-pay

## 2021-08-10 ENCOUNTER — Emergency Department (HOSPITAL_COMMUNITY): Payer: Medicare Other

## 2021-08-10 ENCOUNTER — Encounter (HOSPITAL_COMMUNITY): Payer: Self-pay

## 2021-08-10 ENCOUNTER — Observation Stay (HOSPITAL_COMMUNITY)
Admission: EM | Admit: 2021-08-10 | Discharge: 2021-08-11 | Disposition: A | Discharge status: Home / Self Care | Payer: Medicare Other | Attending: Internal Medicine | Admitting: Internal Medicine

## 2021-08-10 DIAGNOSIS — Z79899 Other long term (current) drug therapy: Secondary | ICD-10-CM | POA: Insufficient documentation

## 2021-08-10 DIAGNOSIS — I10 Essential (primary) hypertension: Secondary | ICD-10-CM

## 2021-08-10 DIAGNOSIS — R778 Other specified abnormalities of plasma proteins: Secondary | ICD-10-CM | POA: Insufficient documentation

## 2021-08-10 DIAGNOSIS — R7989 Other specified abnormal findings of blood chemistry: Secondary | ICD-10-CM | POA: Diagnosis present

## 2021-08-10 DIAGNOSIS — Z7982 Long term (current) use of aspirin: Secondary | ICD-10-CM | POA: Insufficient documentation

## 2021-08-10 DIAGNOSIS — Z8673 Personal history of transient ischemic attack (TIA), and cerebral infarction without residual deficits: Secondary | ICD-10-CM | POA: Insufficient documentation

## 2021-08-10 DIAGNOSIS — I5032 Chronic diastolic (congestive) heart failure: Secondary | ICD-10-CM | POA: Diagnosis not present

## 2021-08-10 DIAGNOSIS — F419 Anxiety disorder, unspecified: Secondary | ICD-10-CM

## 2021-08-10 DIAGNOSIS — I110 Hypertensive heart disease with heart failure: Secondary | ICD-10-CM | POA: Insufficient documentation

## 2021-08-10 DIAGNOSIS — I160 Hypertensive urgency: Secondary | ICD-10-CM | POA: Insufficient documentation

## 2021-08-10 DIAGNOSIS — R079 Chest pain, unspecified: Secondary | ICD-10-CM | POA: Diagnosis present

## 2021-08-10 DIAGNOSIS — I161 Hypertensive emergency: Secondary | ICD-10-CM | POA: Diagnosis not present

## 2021-08-10 DIAGNOSIS — R0789 Other chest pain: Secondary | ICD-10-CM | POA: Diagnosis present

## 2021-08-10 LAB — CBC WITH DIFFERENTIAL/PLATELET
Abs Immature Granulocytes: 0.04 10*3/uL (ref 0.00–0.07)
Basophils Absolute: 0 10*3/uL (ref 0.0–0.1)
Basophils Relative: 0 %
Eosinophils Absolute: 0.1 10*3/uL (ref 0.0–0.5)
Eosinophils Relative: 1 %
HCT: 40.2 % (ref 36.0–46.0)
Hemoglobin: 13.4 g/dL (ref 12.0–15.0)
Immature Granulocytes: 1 %
Lymphocytes Relative: 26 %
Lymphs Abs: 1.9 10*3/uL (ref 0.7–4.0)
MCH: 29.9 pg (ref 26.0–34.0)
MCHC: 33.3 g/dL (ref 30.0–36.0)
MCV: 89.7 fL (ref 80.0–100.0)
Monocytes Absolute: 0.7 10*3/uL (ref 0.1–1.0)
Monocytes Relative: 9 %
Neutro Abs: 4.6 10*3/uL (ref 1.7–7.7)
Neutrophils Relative %: 63 %
Platelets: 256 10*3/uL (ref 150–400)
RBC: 4.48 MIL/uL (ref 3.87–5.11)
RDW: 13.7 % (ref 11.5–15.5)
WBC: 7.4 10*3/uL (ref 4.0–10.5)
nRBC: 0 % (ref 0.0–0.2)

## 2021-08-10 LAB — BASIC METABOLIC PANEL
Anion gap: 9 (ref 5–15)
BUN: 17 mg/dL (ref 8–23)
CO2: 23 mmol/L (ref 22–32)
Calcium: 9 mg/dL (ref 8.9–10.3)
Chloride: 102 mmol/L (ref 98–111)
Creatinine, Ser: 0.76 mg/dL (ref 0.44–1.00)
GFR, Estimated: >60 mL/min (ref >60)
Glucose, Bld: 93 mg/dL (ref 70–99)
Potassium: 4 mmol/L (ref 3.5–5.1)
Sodium: 134 mmol/L — ABNORMAL LOW (ref 135–145)

## 2021-08-10 LAB — HEPATIC FUNCTION PANEL
ALT: 11 U/L (ref 0–44)
AST: 23 U/L (ref 15–41)
Albumin: 2.8 g/dL — ABNORMAL LOW (ref 3.5–5.0)
Alkaline Phosphatase: 39 U/L (ref 38–126)
Bilirubin, Direct: 0.1 mg/dL (ref 0.0–0.2)
Indirect Bilirubin: 0.4 mg/dL (ref 0.3–0.9)
Total Bilirubin: 0.5 mg/dL (ref 0.3–1.2)
Total Protein: 5.2 g/dL — ABNORMAL LOW (ref 6.5–8.1)

## 2021-08-10 LAB — CK: Total CK: 72 U/L (ref 38–234)

## 2021-08-10 LAB — TROPONIN I (HIGH SENSITIVITY)
Troponin I (High Sensitivity): 18 ng/L — ABNORMAL HIGH (ref <18)
Troponin I (High Sensitivity): 31 ng/L — ABNORMAL HIGH (ref <18)

## 2021-08-10 LAB — PHOSPHORUS: Phosphorus: 3.1 mg/dL (ref 2.5–4.6)

## 2021-08-10 LAB — MAGNESIUM: Magnesium: 1.6 mg/dL — ABNORMAL LOW (ref 1.7–2.4)

## 2021-08-10 MED ORDER — SODIUM CHLORIDE 0.9% FLUSH
3.0000 mL | INTRAVENOUS | Status: DC | PRN
Start: 2021-08-10 — End: 2021-08-11

## 2021-08-10 MED ORDER — IPRATROPIUM-ALBUTEROL 0.5-2.5 (3) MG/3ML IN SOLN
3.0000 mL | Freq: Two times a day (BID) | RESPIRATORY_TRACT | Status: DC
  Administered 2021-08-11: 3 mL via RESPIRATORY_TRACT
  Filled 2021-08-10: qty 3

## 2021-08-10 MED ORDER — LABETALOL HCL 5 MG/ML IV SOLN
10.0000 mg | Freq: Once | INTRAVENOUS | Status: DC
  Filled 2021-08-10: qty 4

## 2021-08-10 MED ORDER — NITROGLYCERIN 2 % TD OINT: 1.0000 [in_us] | TOPICAL_OINTMENT | Freq: Once | TRANSDERMAL | Status: DC

## 2021-08-10 MED ORDER — ASPIRIN 81 MG PO CHEW
324.0000 mg | CHEWABLE_TABLET | Freq: Once | ORAL | Status: AC
  Administered 2021-08-10: 324 mg via ORAL
  Filled 2021-08-10: qty 4

## 2021-08-10 MED ORDER — ASPIRIN 81 MG PO TBEC
81.0000 mg | DELAYED_RELEASE_TABLET | Freq: Every day | ORAL | Status: DC
  Administered 2021-08-11: 81 mg via ORAL
  Filled 2021-08-10: qty 1

## 2021-08-10 MED ORDER — DIAZEPAM 2 MG PO TABS
2.0000 mg | ORAL_TABLET | Freq: Once | ORAL | Status: AC
  Administered 2021-08-10: 2 mg via ORAL
  Filled 2021-08-10: qty 1

## 2021-08-10 MED ORDER — LABETALOL HCL 5 MG/ML IV SOLN: 5.0000 mg | INTRAVENOUS | Status: DC | PRN

## 2021-08-10 MED ORDER — SODIUM CHLORIDE 0.9 % IV SOLN: 250.0000 mL | INTRAVENOUS | Status: DC | PRN

## 2021-08-10 MED ORDER — MAGNESIUM SULFATE 2 GM/50ML IV SOLN
2.0000 g | Freq: Once | INTRAVENOUS | Status: AC
  Administered 2021-08-11: 2 g via INTRAVENOUS
  Filled 2021-08-10: qty 50

## 2021-08-10 MED ORDER — IRBESARTAN 75 MG PO TABS
75.0000 mg | ORAL_TABLET | Freq: Every day | ORAL | Status: DC
  Administered 2021-08-11: 75 mg via ORAL
  Filled 2021-08-10: qty 1

## 2021-08-10 MED ORDER — SODIUM CHLORIDE 0.9% FLUSH
3.0000 mL | Freq: Two times a day (BID) | INTRAVENOUS | Status: DC
Start: 2021-08-10 — End: 2021-08-11
  Administered 2021-08-11 (×2): 3 mL via INTRAVENOUS

## 2021-08-10 NOTE — ED Triage Notes (Signed)
Patient complaining of HTN dizziness and shakiness.  Had front headache this morning but symptoms have been going on for awhile.  Patient was started on lexapro but not explained how to take it so she was taking one here and there.  She was on BP meds and then bp dropped MD took her off but recently started her back on BP meds. No weakness, facial droop.  Negative stroke screening.

## 2021-08-10 NOTE — H&P (Signed)
Rebecca SimonsJannette S Bettcher LKG:[id number] DOB: 11/04/1940 DOA: 08/10/2021    PCP: Malka SoJobe, Daniel B., MD   Outpatient Specialists:  CARDS:  Dr. Axel Fillerausey, Delbert HarnessPercy T Jr., MD   at [facility]    Patient arrived to ER on 08/10/21 at 1804 Referred by Attending Therisa Doyneoutova, Sissy Goetzke, MD   Patient coming from:    home Lives alone,     Chief Complaint:   Chief Complaint  Patient presents with   Hypertension    HPI: Olen PelJannette S Hurley is a 81 y.o. female with medical history significant of HTN, arthritis    Presented with   Chest pain  Presented with CP with elevated BP to 200's was feeling lightheaded on BP meds and cardiology has stopped it She tried to restart the meds because she felt like she was having elevated bp  She continued to have anxious sensation and CP today called EMS and her BP was in 200   Does not smoke  Drinks wine everyother day Denies any history of known CAD.  [facility].   Regarding pertinent Chronic problems:     Hyperlipidemia -  not on statins  Lipid Panel     Component Value Date/Time   CHOL 214 (H) 04/11/2016 0249   TRIG 56 04/11/2016 0249   HDL 86 04/11/2016 0249   CHOLHDL 2.5 04/11/2016 0249   VLDL 11 04/11/2016 0249   LDLCALC 117 (H) 04/11/2016 0249     HTN on MIcardis   chronic CHF diastolic/ - last echo 2018     Hx of TIA -  with/out residual deficits on Aspirin 81 mg     While in ER:   Noted initial BP up to 180 now down to 160 with no meds      CXR -  NON acute    Following Medications were ordered in ER: Medications  labetalol (NORMODYNE) injection 10 mg (has no administration in time range)  aspirin chewable tablet 324 mg (324 mg Oral Given 08/10/21 2114)  diazepam (VALIUM) tablet 2 mg (2 mg Oral Given 08/10/21 2114)       ED Triage Vitals  Enc Vitals Group     BP 08/10/21 1812 (!) 211/99     Pulse Rate 08/10/21 1812 75     Resp 08/10/21 1812 17     Temp 08/10/21 1813 98.4 F (36.9 C)     Temp  Source 08/10/21 1813 Oral     SpO2 08/10/21 1812 98 %     Weight 08/10/21 1813 122 lb (55.3 kg)     Height 08/10/21 1813 5' (1.524 m)     Head Circumference --      Peak Flow --      Pain Score 08/10/21 1813 0     Pain Loc --      Pain Edu? --      Excl. in GC? --   TMAX(24)@     _________________________________________ Significant initial  Findings: Abnormal Labs Reviewed  BASIC METABOLIC PANEL - Abnormal; Notable for the following components:      Result Value   Sodium 134 (*)    All other components within normal limits  TROPONIN I (HIGH SENSITIVITY) - Abnormal; Notable for the following components:   Troponin I (High Sensitivity) 18 (*)    All other components within normal limits     _________________________ Troponin  18 ECG: Ordered Personally reviewed and interpreted by me showing: HR : 75 Rhythm:Sinus rhythm Probable left atrial  enlargement RSR' in V1 or V2, right VCD or RVH No significant change was found QTC 445    The recent clinical data is shown below. Vitals:   08/10/21 2030 08/10/21 2035 08/10/21 2045 08/10/21 2100  BP:  (!) 202/91 (!) 184/77 (!) 148/81  Pulse: 68 97 70 69  Resp: 10 18 14 17   Temp:      TempSrc:  Oral    SpO2: 97% 98% 93% 97%  Weight:      Height:         WBC     Component Value Date/Time   WBC 7.4 08/10/2021 1905   LYMPHSABS 1.9 08/10/2021 1905   MONOABS 0.7 08/10/2021 1905   EOSABS 0.1 08/10/2021 1905   BASOSABS 0.0 08/10/2021 1905      UA   ordered     Results for orders placed or performed during the hospital encounter of 12/15/19  Urine culture     Status: Abnormal   Collection Time: 12/15/19 10:06 PM   Specimen: Urine, Clean Catch  Result Value Ref Range Status   Specimen Description   Final    URINE, CLEAN CATCH Performed at [facility], [address], [place], [place]    Special Requests   Final    NONE Performed at [facility], [address]  [place], [place]    Culture (A)  Final    <10,000 COLONIES/mL INSIGNIFICANT GROWTH Performed at [facility], [address] [place], [place]    Report Status 12/17/2019 FINAL  Final    _______________________________________________ Hospitalist was called for admission for hypertensive urgency   The following Work up has been ordered so far:  Orders Placed This Encounter  Procedures   DG Chest Portable 1 View   CBC with Differential   Basic metabolic panel   Ethanol   Cardiac monitoring   Consult to hospitalist   EKG 12-Lead   ECHOCARDIOGRAM COMPLETE   Place in observation (patient's expected length of stay will be less than 2 midnights)     OTHER Significant initial  Findings:  labs showing:    Recent Labs  Lab 08/10/21 1905  NA 134*  K 4.0  CO2 23  GLUCOSE 93  BUN 17  CREATININE 0.76  CALCIUM 9.0    Cr   stable,    Lab Results  Component Value Date   CREATININE 0.76 08/10/2021   CREATININE 0.74 12/15/2019   CREATININE 0.50 10/21/2018    Recent Labs  Lab 08/10/21 2123  AST 23  ALT 11  ALKPHOS 39  BILITOT 0.5  PROT 5.2*  ALBUMIN 2.8*   Lab Results  Component Value Date   CALCIUM 9.0 08/10/2021       Plt: Lab Results  Component Value Date   PLT 256 08/10/2021        Recent Labs  Lab 08/10/21 1905  WBC 7.4  NEUTROABS 4.6  HGB 13.4  HCT 40.2  MCV 89.7  PLT 256    HG/HCT  stable,       Component Value Date/Time   HGB 13.4 08/10/2021 1905   HCT 40.2 08/10/2021 1905   MCV 89.7 08/10/2021 1905        Cultures:    Component Value Date/Time   SDES  12/15/2019 2206    URINE, CLEAN CATCH Performed at [facility], [address] [place], [place]    SPECREQUEST  12/15/2019 2206    NONE  Performed at [facility], [address], [place], [place] [address]    CULT (A) 12/15/2019 2206    <10,000 COLONIES/mL INSIGNIFICANT GROWTH Performed at [facility], [address] [place], [place] [address]    REPTSTATUS 12/17/2019 FINAL 12/15/2019 2206     Radiological Exams on Admission: DG Chest Portable 1 View  Result Date: 08/10/2021 CLINICAL DATA:  Chest discomfort EXAM: PORTABLE CHEST 1 VIEW COMPARISON:  Chest x-ray 10/16/2018 FINDINGS: The heart size and mediastinal contours are within normal limits. Both lungs are clear. The visualized skeletal structures are unremarkable. IMPRESSION: No active disease. Electronically Signed   By: Darliss Cheney M.D.   On: 08/10/2021 19:38   _______________________________________________________________________________________________________ Latest  Blood pressure (!) 148/81, pulse 69, temperature 98.4 F (36.9 C), temperature source Oral, resp. rate 17, height 5' (1.524 m), weight 55.3 kg, SpO2 97 %.   Vitals  labs and radiology finding personally reviewed  Review of Systems:    Pertinent positives include:   chest pain, anxiety  Constitutional:  No weight loss, night sweats, Fevers, chills, fatigue, weight loss  HEENT:  No headaches, Difficulty swallowing,Tooth/dental problems,Sore throat,  No sneezing, itching, ear ache, nasal congestion, post nasal drip,  Cardio-vascular:  No Orthopnea, PND, anasarca, dizziness, palpitations.no Bilateral lower extremity swelling  GI:  No heartburn, indigestion, abdominal pain, nausea, vomiting, diarrhea, change in bowel habits, loss of appetite, melena, blood in stool, hematemesis Resp:  no shortness of breath at rest. No dyspnea on exertion, No excess mucus, no productive cough, No non-productive cough, No coughing up of blood.No change in color of mucus.No wheezing. Skin:  no rash or lesions. No jaundice GU:  no dysuria, change in color of urine, no urgency or frequency. No straining to urinate.  No flank pain.  Musculoskeletal:  No joint pain or no joint swelling. No decreased range of motion. No back pain.  Psych:  No change in mood or affect. No  depression or anxiety. No memory loss.  Neuro: no localizing neurological complaints, no tingling, no weakness, no double vision, no gait abnormality, no slurred speech, no confusion  All systems reviewed and apart from HOPI all are negative _______________________________________________________________________________________________ Past Medical History:   Past Medical History:  Diagnosis Date   Hypertension       Past Surgical History:  Procedure Laterality Date   BOWEL RESECTION N/A 10/16/2018   Procedure: Small Bowel Resection;  Surgeon: Violeta Gelinas, MD;  Location: [facility] OR;  Service: General;  Laterality: N/A;   LAPAROTOMY N/A 10/16/2018   Procedure: EXPLORATORY LAPAROTOMY;  Surgeon: Violeta Gelinas, MD;  Location: [facility] OR;  Service: General;  Laterality: N/A;   SMALL BOWEL REPAIR N/A 10/16/2018   Procedure: Small Bowel Repair;  Surgeon: Violeta Gelinas, MD;  Location: [facility] OR;  Service: General;  Laterality: N/A;    Social History:  Ambulatory   independently       reports that she has never smoked. She has never used smokeless tobacco. She reports that she does not drink alcohol and does not use drugs.     Family History:  Cousins have HTN  History reviewed. No pertinent family history. ______________________________________________________________________________________________ Allergies: Allergies  Allergen Reactions   Sulfa Antibiotics Other (See Comments)    Severe reaction-per family   Amlodipine     Other reaction(s): Redness Flushing of face & neck, followed by generalized weakness   Erythromycin Other (See Comments)    Potential ear troubles/ deafness   Tylenol [Acetaminophen] Other (See Comments)  Altered mental status and mental changes   Tylenol [Acetaminophen] Other (See Comments)    Pt stated is causes her to have altered mental status    Azithromycin Tinitus   Sulfa Antibiotics Hives     Prior to Admission medications   Medication Sig Start  Date End Date Taking? Authorizing Provider  aspirin EC 81 MG tablet Take 1 tablet (81 mg total) by mouth daily. 04/11/16   Rodolph Bong, MD  CALCIUM-VITAMIN D PO Take 1 tablet by mouth 2 (two) times daily.    [provider]  CRANBERRY JUICE EXTRACT PO Take 1 tablet by mouth 3 (three) times a week.    [provider]  feeding supplement, ENSURE ENLIVE, (ENSURE ENLIVE) LIQD Take 237 mLs by mouth 2 (two) times daily between meals. 10/23/18   Meuth, Brooke A, PA-C  Flaxseed, Linseed, (FLAX SEED OIL PO) Take 5 mLs by mouth daily.    [provider]  Ginkgo Biloba (GNP GINGKO BILOBA EXTRACT PO) Take 40 mg by mouth daily.    [provider]  glucosamine-chondroitin 500-400 MG tablet Take 1 tablet by mouth 2 (two) times daily.    [provider]  IODINE, KELP, PO Take 1 tablet by mouth daily.    [provider]  ipratropium-albuterol (DUONEB) 0.5-2.5 (3) MG/3ML SOLN Take 3 mLs by nebulization 2 (two) times daily. 02/02/20   Hunsucker, Lesia Sago, MD  LUTEIN PO Take 120 mg by mouth daily.    [provider]  telmisartan (MICARDIS) 20 MG tablet Take 20 mg by mouth 2 (two) times daily.  09/30/18   [provider]  vitamin C (ASCORBIC ACID) 500 MG tablet Take 500 mg by mouth 2 (two) times daily.    [provider]    ___________________________________________________________________________________________________ Physical Exam:    08/10/2021    9:00 PM 08/10/2021    8:45 PM 08/10/2021    8:35 PM  Vitals with BMI  Systolic 148 184 161  Diastolic 81 77 91  Pulse 69 70 97     1. General:  in No  Acute distress   well   -appearing 2. Psychological: Alert and   Oriented 3. Head/ENT:  Dry Mucous Membranes                          Head Non traumatic, neck supple                          Poor Dentition 4. SKIN:  decreased Skin turgor,  Skin clean Dry and intact no rash 5. Heart: Regular rate and rhythm no  Murmur, no Rub  or gallop 6. Lungs:  no wheezes or crackles   7. Abdomen: Soft,  non-tender, Non distended bowel sounds present 8. Lower extremities: no clubbing, cyanosis, no  edema 9. Neurologically Grossly intact, moving all 4 extremities equally   10. MSK: Normal range of motion    Chart has been reviewed  ______________________________________________________________________________________________  Assessment/Plan 81 y.o. female with medical history significant of HTN, arthritis  Admitted for hypertensive emergency  Present on Admission:  Hypertensive emergency  Elevated troponin  Hypomagnesemia  Chest pain     Hypertensive emergency Restart home medications switch to Avapro 75 mg p.o. daily Monitor BP avoid over aggressive drop Hypertensive emergency given elevated troponin  Appreciate cardiology consult Patient currently denies any chest pain describes a sensation more as feeling nervous.   Elevated troponin Slightly elevated  in the setting of significant hypertension.  Patient currently chest pain-free Obtain echogram.  Appreciate cardiology consult will notify them patient is being admitted  Hypomagnesemia Will replace and recheck In AM   Chest pain Patient denies any was actually a true chest pain just feels like she felt like she was anxious.  Nonetheless given slight rise in troponin and evidence of hypertensive emergency.  Will appreciate cardiology evaluation.  Will order echogram.    Other plan as per orders.  DVT prophylaxis:  SCD     Code Status:  DNR/DNI  as per patient   I had personally discussed CODE STATUS with patient and family    Family Communication:   Family   at  Bedside  plan of care was discussed   with  grandson,   Disposition Plan:    To home once workup is complete and patient is stable   Following barriers for discharge:                            Electrolytes corrected                                                        Will need  consultants to evaluate patient prior to discharge   Would benefit from PT/OT eval prior to DC  Ordered                                      Consults called:  emailed cardiology   Admission status:  ED Disposition     ED Disposition  Admit   Condition  --   Comment  Hospital Area: [facility] [[id number]]  Level of Care: Progressive [102]  Admit to Progressive based on following criteria: CARDIOVASCULAR & THORACIC of moderate stability with acute coronary syndrome symptoms/low risk myocardial infarction/hypertensive urgency/arrhythmias/heart failure potentially compromising stability and stable post cardiovascular intervention patients.  May place patient in observation at [facility] or [facility] if equivalent level of care is available:: No  Covid Evaluation: Asymptomatic - no recent exposure (last 10 days) testing not required  Diagnosis: Hypertensive urgency [093267]  Admitting Physician: Therisa Doyne [[id number]]  Attending Physician: Therisa Doyne [[id number]]           Obs      I expect  patient to be hospitalized for 2 midnights requiring inpatient medical care.      Level of care        progressive tele indefinitely please discontinue once patient no longer qualifies COVID-19 Labs   Amariyah Bazar 08/10/2021, 11:21 PM    [facility]     after 2 AM please page floor coverage PA If 7AM-7PM, please contact the day team taking care of the patient using [email or web address]   Patient was evaluated in the context of the global COVID-19 pandemic, which necessitated consideration that the patient might be at risk for infection with the SARS-CoV-2 virus that causes COVID-19. Institutional protocols and algorithms that pertain to the evaluation of patients at risk for COVID-19 are in a state of rapid change based on information released by regulatory bodies including the CDC and federal and state organizations. These policies and algorithms were  followed during  the patient's care.

## 2021-08-10 NOTE — Assessment & Plan Note (Signed)
Will replace and recheck In AM

## 2021-08-10 NOTE — Assessment & Plan Note (Signed)
Slightly elevated in the setting of significant hypertension.  Patient currently chest pain-free Obtain echogram.  Appreciate cardiology consult will notify them patient is being admitted

## 2021-08-10 NOTE — ED Notes (Signed)
ED TO INPATIENT HANDOFF REPORT  ED Nurse Name and Phone #: [phone] [place] Service Name/Age/Gender Rebecca Hurley 81 y.o. female Room/Bed: WA14/WA14  Code Status   Code Status: Prior  Home/SNF/Other Home Patient oriented to: self, place, time, and situation Is this baseline? Yes   Triage Complete: Triage complete  Chief Complaint Hypertensive urgency [I16.0]  Triage Note Patient complaining of HTN dizziness and shakiness.  Had front headache this morning but symptoms have been going on for awhile.  Patient was started on lexapro but not explained how to take it so she was taking one here and there.  She was on BP meds and then bp dropped MD took her off but recently started her back on BP meds. No weakness, facial droop.  Negative stroke screening.    Allergies Allergies  Allergen Reactions   Sulfa Antibiotics Other (See Comments)    Severe reaction-per family   Amlodipine     Other reaction(s): Redness Flushing of face & neck, followed by generalized weakness   Erythromycin Other (See Comments)    Potential ear troubles/ deafness   Tylenol [Acetaminophen] Other (See Comments)    Altered mental status and mental changes   Tylenol [Acetaminophen] Other (See Comments)    Pt stated is causes her to have altered mental status    Azithromycin Tinitus   Sulfa Antibiotics Hives    Level of Care/Admitting Diagnosis ED Disposition     ED Disposition  Admit   Condition  --   Comment  Hospital Area: [facility] [[id number]]  Level of Care: Progressive [102]  Admit to Progressive based on following criteria: CARDIOVASCULAR & THORACIC of moderate stability with acute coronary syndrome symptoms/low risk myocardial infarction/hypertensive urgency/arrhythmias/heart failure potentially compromising stability and stable post cardiovascular intervention patients.  May place patient in observation at [facility] or [facility] if equivalent level of care is  available:: No  Covid Evaluation: Asymptomatic - no recent exposure (last 10 days) testing not required  Diagnosis: Hypertensive urgency [366440]  Admitting Physician: Therisa Doyne [[id number]]  Attending Physician: Therisa Doyne [[id number]]          B Medical/Surgery History Past Medical History:  Diagnosis Date   Hypertension    Past Surgical History:  Procedure Laterality Date   BOWEL RESECTION N/A 10/16/2018   Procedure: Small Bowel Resection;  Surgeon: Violeta Gelinas, MD;  Location: [facility] OR;  Service: General;  Laterality: N/A;   LAPAROTOMY N/A 10/16/2018   Procedure: EXPLORATORY LAPAROTOMY;  Surgeon: Violeta Gelinas, MD;  Location: [facility] OR;  Service: General;  Laterality: N/A;   SMALL BOWEL REPAIR N/A 10/16/2018   Procedure: Small Bowel Repair;  Surgeon: Violeta Gelinas, MD;  Location: [facility] OR;  Service: General;  Laterality: N/A;     A IV Location/Drains/Wounds Patient Lines/Drains/Airways Status     Active Line/Drains/Airways     Name Placement date Placement time Site Days   Peripheral IV 08/10/21 18 G Left Antecubital 08/10/21  1913  Antecubital  less than 1   Incision (Closed) 10/16/18 Abdomen 10/16/18  1809  -- 1029   Incision (Closed) 10/16/18 Groin Right 10/16/18  1821  -- 1029   Incision (Closed) 10/16/18 Knee Right 10/16/18  1821  -- 1029            Intake/Output Last 24 hours No intake or output data in the 24 hours ending 08/10/21 2202  Labs/Imaging Results for orders placed or performed during the hospital encounter of 08/10/21 (from the past 48 hour(s))  CBC with Differential     Status: None   Collection Time: 08/10/21  7:05 PM  Result Value Ref Range   WBC 7.4 4.0 - 10.5 K/uL   RBC 4.48 3.87 - 5.11 MIL/uL   Hemoglobin 13.4 12.0 - 15.0 g/dL   HCT 09.8 11.9 - 14.7 %   MCV 89.7 80.0 - 100.0 fL   MCH 29.9 26.0 - 34.0 pg   MCHC 33.3 30.0 - 36.0 g/dL   RDW 82.9 56.2 - 13.0 %   Platelets 256 150 - 400 K/uL   nRBC 0.0 0.0 - 0.2 %   Neutrophils  Relative % 63 %   Neutro Abs 4.6 1.7 - 7.7 K/uL   Lymphocytes Relative 26 %   Lymphs Abs 1.9 0.7 - 4.0 K/uL   Monocytes Relative 9 %   Monocytes Absolute 0.7 0.1 - 1.0 K/uL   Eosinophils Relative 1 %   Eosinophils Absolute 0.1 0.0 - 0.5 K/uL   Basophils Relative 0 %   Basophils Absolute 0.0 0.0 - 0.1 K/uL   Immature Granulocytes 1 %   Abs Immature Granulocytes 0.04 0.00 - 0.07 K/uL    Comment: Performed at [facility], [address] [place], [place] [address]  Basic metabolic panel     Status: Abnormal   Collection Time: 08/10/21  7:05 PM  Result Value Ref Range   Sodium 134 (L) 135 - 145 mmol/L   Potassium 4.0 3.5 - 5.1 mmol/L   Chloride 102 98 - 111 mmol/L   CO2 23 22 - 32 mmol/L   Glucose, Bld 93 70 - 99 mg/dL    Comment: Glucose reference range applies only to samples taken after fasting for at least 8 hours.   BUN 17 8 - 23 mg/dL   Creatinine, Ser 4.69 0.44 - 1.00 mg/dL   Calcium 9.0 8.9 - 62.9 mg/dL   GFR, Estimated >52 >84 mL/min    Comment: (NOTE) Calculated using the CKD-EPI Creatinine Equation (2021)    Anion gap 9 5 - 15    Comment: Performed at [facility], [address], [place], [place] [address]  Troponin I (High Sensitivity)     Status: Abnormal   Collection Time: 08/10/21  7:05 PM  Result Value Ref Range   Troponin I (High Sensitivity) 18 (H) <18 ng/L    Comment: (NOTE) Elevated high sensitivity troponin I (hsTnI) values and significant  changes across serial measurements may suggest ACS but many other  chronic and acute conditions are known to elevate hsTnI results.  Refer to the "Links" section for chest pain algorithms and additional  guidance. Performed at [facility], [address] [place], [place] [address]    DG Chest Portable 1 View  Result Date: 08/10/2021 CLINICAL DATA:  Chest discomfort EXAM: PORTABLE CHEST 1 VIEW COMPARISON:  Chest x-ray 10/16/2018 FINDINGS: The heart size and  mediastinal contours are within normal limits. Both lungs are clear. The visualized skeletal structures are unremarkable. IMPRESSION: No active disease. Electronically Signed   By: Darliss Cheney M.D.   On: 08/10/2021 19:38    Pending Labs Unresulted Labs (From admission, onward)     Start     Ordered   08/10/21 2133  CK  Add-on,   AD        08/10/21 2132   08/10/21 2133  Magnesium  Add-on,   AD        08/10/21 2132   08/10/21 2133  Phosphorus  Add-on,  AD        08/10/21 2132   08/10/21 2133  Hepatic function panel  Add-on,   AD       Question:  Release to patient  Answer:  Immediate   08/10/21 2132   08/10/21 2133  TSH  Add-on,   AD        08/10/21 2132   08/10/21 2120  Ethanol  Add-on,   AD        08/10/21 2119   Signed and Held  Comprehensive metabolic panel  Tomorrow morning,   R       Question:  Release to patient  Answer:  Immediate   Signed and Held   Signed and Held  CBC  Tomorrow morning,   R       Question:  Release to patient  Answer:  Immediate   Signed and Held            Vitals/Pain Today's Vitals   08/10/21 2030 08/10/21 2035 08/10/21 2045 08/10/21 2100  BP:  (!) 202/91 (!) 184/77 (!) 148/81  Pulse: 68 97 70 69  Resp: 10 18 14 17   Temp:      TempSrc:  Oral    SpO2: 97% 98% 93% 97%  Weight:      Height:      PainSc:        Isolation Precautions No active isolations  Medications Medications  labetalol (NORMODYNE) injection 10 mg (0 mg Intravenous Hold 08/10/21 2129)  aspirin chewable tablet 324 mg (324 mg Oral Given 08/10/21 2114)  diazepam (VALIUM) tablet 2 mg (2 mg Oral Given 08/10/21 2114)    Mobility walks Low fall risk   Focused Assessments    R Recommendations: See Admitting Provider Note  Report given to:   Additional Notes:

## 2021-08-10 NOTE — Assessment & Plan Note (Addendum)
Patient denies any was actually a true chest pain just feels like she felt like she was anxious.  Nonetheless given slight rise in troponin and evidence of hypertensive emergency.  Will appreciate cardiology evaluation.  Will order echogram.

## 2021-08-10 NOTE — Subjective & Objective (Signed)
Presented with CP with elevated BP to 200's was feeling lightheaded on BP meds and cardiology has stopped it She tried to restart the meds because she felt like she was having elevated bp  She continued to have anxious sensation and CP today called EMS and her BP was in 200

## 2021-08-10 NOTE — Assessment & Plan Note (Addendum)
Restart home medications switch to Avapro 75 mg p.o. daily Monitor BP avoid over aggressive drop Hypertensive emergency given elevated troponin  Appreciate cardiology consult Patient currently denies any chest pain describes a sensation more as feeling nervous.
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
## 2021-08-11 ENCOUNTER — Encounter (HOSPITAL_COMMUNITY): Payer: Self-pay | Admitting: Internal Medicine

## 2021-08-11 ENCOUNTER — Observation Stay (HOSPITAL_BASED_OUTPATIENT_CLINIC_OR_DEPARTMENT_OTHER): Payer: Medicare Other

## 2021-08-11 DIAGNOSIS — F419 Anxiety disorder, unspecified: Secondary | ICD-10-CM | POA: Diagnosis not present

## 2021-08-11 DIAGNOSIS — R7989 Other specified abnormal findings of blood chemistry: Secondary | ICD-10-CM

## 2021-08-11 DIAGNOSIS — I10 Essential (primary) hypertension: Secondary | ICD-10-CM

## 2021-08-11 DIAGNOSIS — I161 Hypertensive emergency: Secondary | ICD-10-CM | POA: Diagnosis not present

## 2021-08-11 DIAGNOSIS — I159 Secondary hypertension, unspecified: Secondary | ICD-10-CM | POA: Diagnosis not present

## 2021-08-11 DIAGNOSIS — R079 Chest pain, unspecified: Secondary | ICD-10-CM

## 2021-08-11 LAB — CBC
HCT: 41.8 % (ref 36.0–46.0)
Hemoglobin: 13.6 g/dL (ref 12.0–15.0)
MCH: 29.8 pg (ref 26.0–34.0)
MCHC: 32.5 g/dL (ref 30.0–36.0)
MCV: 91.5 fL (ref 80.0–100.0)
Platelets: 242 10*3/uL (ref 150–400)
RBC: 4.57 MIL/uL (ref 3.87–5.11)
RDW: 13.8 % (ref 11.5–15.5)
WBC: 6.2 10*3/uL (ref 4.0–10.5)
nRBC: 0 % (ref 0.0–0.2)

## 2021-08-11 LAB — COMPREHENSIVE METABOLIC PANEL
ALT: 12 U/L (ref 0–44)
AST: 25 U/L (ref 15–41)
Albumin: 3.4 g/dL — ABNORMAL LOW (ref 3.5–5.0)
Alkaline Phosphatase: 46 U/L (ref 38–126)
Anion gap: 8 (ref 5–15)
BUN: 13 mg/dL (ref 8–23)
CO2: 25 mmol/L (ref 22–32)
Calcium: 8.8 mg/dL — ABNORMAL LOW (ref 8.9–10.3)
Chloride: 105 mmol/L (ref 98–111)
Creatinine, Ser: 0.73 mg/dL (ref 0.44–1.00)
GFR, Estimated: >60 mL/min (ref >60)
Glucose, Bld: 89 mg/dL (ref 70–99)
Potassium: 3.4 mmol/L — ABNORMAL LOW (ref 3.5–5.1)
Sodium: 138 mmol/L (ref 135–145)
Total Bilirubin: 0.8 mg/dL (ref 0.3–1.2)
Total Protein: 6.3 g/dL — ABNORMAL LOW (ref 6.5–8.1)

## 2021-08-11 LAB — ECHOCARDIOGRAM COMPLETE
Area-P 1/2: 2.74 cm2
Height: 60 in
S' Lateral: 1.6 cm
Weight: 1952 oz

## 2021-08-11 LAB — TROPONIN I (HIGH SENSITIVITY)
Troponin I (High Sensitivity): 12 ng/L (ref <18)
Troponin I (High Sensitivity): 30 ng/L — ABNORMAL HIGH (ref <18)
Troponin I (High Sensitivity): 36 ng/L — ABNORMAL HIGH (ref <18)

## 2021-08-11 LAB — TSH: TSH: 3.396 u[IU]/mL (ref 0.350–4.500)

## 2021-08-11 LAB — MAGNESIUM: Magnesium: 3.2 mg/dL — ABNORMAL HIGH (ref 1.7–2.4)

## 2021-08-11 LAB — ETHANOL: Alcohol, Ethyl (B): <10 mg/dL (ref <10)

## 2021-08-11 LAB — D-DIMER, QUANTITATIVE: D-Dimer, Quant: 0.54 ug/mL-FEU — ABNORMAL HIGH (ref 0.00–0.50)

## 2021-08-11 MED ORDER — LABETALOL HCL 5 MG/ML IV SOLN: 10.0000 mg | INTRAVENOUS | Status: DC | PRN

## 2021-08-11 MED ORDER — TELMISARTAN 20 MG PO TABS: 10.0000 mg | ORAL_TABLET | Freq: Every day | ORAL | Status: AC

## 2021-08-11 MED ORDER — HYDRALAZINE HCL 25 MG PO TABS: 25.0000 mg | ORAL_TABLET | ORAL | Status: DC | PRN

## 2021-08-11 NOTE — Consult Note (Signed)
Cardiology Consultation:   Patient ID: [name] MRN: [id number]; DOB: 06-Oct-1940  Admit date: 08/10/2021 Date of Consult: 08/11/2021  PCP:  Malka So., MD   [facility] Providers Cardiologist:  None -- [facility]   Patient Profile:   Rebecca Hurley is a 81 y.o. female with a hx of HTN, palpitations, arthritis who is being seen 08/11/2021 for the evaluation of elevated troponin at the request of Dr. Adela Glimpse.  History of Present Illness:   Ms. Rebecca Hurley is an 81 year old female with above medical history who is followed by [facility]. Per chart review, patient was referred to [facility] cardiology in 01/2020.  At that time, patient complained of palpitations, dyspnea on exertion, elevated blood pressures.  Patient wear a 30-day event monitor in January 2022, showed that the patient had remained in sinus rhythm with an average heart rate of 68 bpm, was bradycardic 31% of total time, was tachycardic 4% of total time, very few PVCs were represented (<1%), 1 brief run of SVT.  Patient also had an echocardiogram completed on 01/21/2020 that showed LVEF 60-65%, no regional wall motion abnormalities, normal diastolic function, no hemodynamically significant valvular disease.  Patient was most recently seen by her cardiologist on 07/21/2021.  At that appointment, patient complained of frequent dizziness with standing.  Patient reported that her BP at home was consistently low with systolic in the 110-115 range and diastolic around 60.  Was encouraged to stop her Micardis and continue to check BP at home.  Patient presented to the ED on 8/3 complaining of hypertension, dizziness, shakiness.  Reportedly had a frontal headache this morning.  Of note, patient recently had been started on Lexapro but was not taking it consistently.  She had recently restarted her BP medications. Found to have BP elevated to 202/91 in the ED. Patient denied having chest pain, but did feel anxious. Labs in the ED  showed Na 134, K 4.0, creatinine 0.76, mag 1.6, WBC 7.4, hemoglobin 13.4, platelets 256. hsTn 18>>31>>36>>30.   CXR showed no active disease. EKG showed sinus rhythm, HR 75 BPM, possible LA enlargement. There were no ST or T wave abnormalities noted.   On interview, patient reports that her cardiologist told her to stop her blood pressure medicine on 7/14 because she was having episodes of dizziness upon standing.  She stopped her medications, however noticed her blood pressure was starting to increase.  Her cardiologist encouraged her to stay off the medicine for 2 weeks to see if her dizziness improved.  Her primary care provider recently started her on Lexapro as she was having issues with anxiety.  Reports that her husband passed away last year, and since then has been having episodes of nervousness/panic.  She has not been taking Lexapro every day, rather takes it when she feels anxious.  Yesterday, patient felt like she had a "panic attack".  She felt very shaky, attempted to take the Lexapro without improvement.  Felt mild palpitations, which usually occurs when she feels anxious.  She denies having any chest pain.  Patient has had chronic shortness of breath on exertion since she had pneumonia in 2019, denies any worsening of shortness of breath.  Past Medical History:  Diagnosis Date   Hypertension     Past Surgical History:  Procedure Laterality Date   BOWEL RESECTION N/A 10/16/2018   Procedure: Small Bowel Resection;  Surgeon: Violeta Gelinas, MD;  Location: [facility] OR;  Service: General;  Laterality: N/A;   LAPAROTOMY N/A 10/16/2018  Procedure: EXPLORATORY LAPAROTOMY;  Surgeon: Violeta Gelinas, MD;  Location: [facility] OR;  Service: General;  Laterality: N/A;   SMALL BOWEL REPAIR N/A 10/16/2018   Procedure: Small Bowel Repair;  Surgeon: Violeta Gelinas, MD;  Location: [facility] OR;  Service: General;  Laterality: N/A;     Home Medications:  Prior to Admission medications   Medication Sig Start Date End  Date Taking? Authorizing Provider  aspirin EC 81 MG tablet Take 1 tablet (81 mg total) by mouth daily. 04/11/16  Yes Rodolph Bong, MD  CALCIUM-VITAMIN D PO Take 1 tablet by mouth 2 (two) times daily.   Yes [provider]  escitalopram (LEXAPRO) 10 MG tablet Take 10 mg by mouth daily. 07/15/21  Yes [provider]  feeding supplement, ENSURE ENLIVE, (ENSURE ENLIVE) LIQD Take 237 mLs by mouth 2 (two) times daily between meals. 10/23/18  Yes Meuth, Brooke A, PA-C  Flaxseed, Linseed, (FLAX SEED OIL PO) Take 5 mLs by mouth daily.   Yes [provider]  Ginkgo Biloba (GNP GINGKO BILOBA EXTRACT PO) Take 40 mg by mouth daily.   Yes [provider]  glucosamine-chondroitin 500-400 MG tablet Take 1 tablet by mouth 2 (two) times daily.   Yes [provider]  LUTEIN PO Take 120 mg by mouth daily.   Yes [provider]  telmisartan (MICARDIS) 20 MG tablet Take 20 mg by mouth 2 (two) times daily.  09/30/18  Yes [provider]  vitamin C (ASCORBIC ACID) 500 MG tablet Take 500 mg by mouth 2 (two) times daily.   Yes [provider]  ipratropium-albuterol (DUONEB) 0.5-2.5 (3) MG/3ML SOLN Take 3 mLs by nebulization 2 (two) times daily. Patient not taking: Reported on 08/10/2021 02/02/20   Hunsucker, Lesia Sago, MD    Inpatient Medications: Scheduled Meds:  aspirin EC  81 mg Oral Daily   ipratropium-albuterol  3 mL Nebulization BID   irbesartan  75 mg Oral Daily   labetalol  10 mg Intravenous Once   sodium chloride flush  3 mL Intravenous Q12H   Continuous Infusions:  sodium chloride     PRN Meds: sodium chloride, labetalol, sodium chloride flush  Allergies:    Allergies  Allergen Reactions   Sulfa Antibiotics Other (See Comments)    Severe reaction-per family   Amlodipine     Other reaction(s): Redness Flushing of face & neck, followed by generalized weakness   Erythromycin Other (See Comments)    Potential ear troubles/  deafness   Tylenol [Acetaminophen] Other (See Comments)    Altered mental status and mental changes   Tylenol [Acetaminophen] Other (See Comments)    Pt stated is causes her to have altered mental status    Azithromycin Tinitus   Sulfa Antibiotics Hives    Social History:   Social History   Socioeconomic History   Marital status: Widowed    Spouse name: Not on file   Number of children: Not on file   Years of education: Not on file   Highest education level: Not on file  Occupational History   Not on file  Tobacco Use   Smoking status: Never   Smokeless tobacco: Never  Vaping Use   Vaping Use: Unknown  Substance and Sexual Activity   Alcohol use: Never   Drug use: Never   Sexual activity: Not on file  Other Topics Concern   Not on file  Social History Narrative   ** Merged History Encounter **       Social Determinants  of Health   Financial Resource Strain: Not on file  Food Insecurity: Not on file  Transportation Needs: Not on file  Physical Activity: Not on file  Stress: Not on file  Social Connections: Not on file  Intimate Partner Violence: Not on file    Family History:   History reviewed. No pertinent family history.   ROS:  Please see the history of present illness.   All other ROS reviewed and negative.     Physical Exam/Data:   Vitals:   08/10/21 2200 08/10/21 2215 08/10/21 2253 08/11/21 0259  BP: (!) 157/75  (!) 170/81 138/71  Pulse: 65 65 75 63  Resp: 14 14 17 15   Temp:  97.8 F (36.6 C) 98 F (36.7 C) 98 F (36.7 C)  TempSrc:      SpO2: 96% 96% 98% 98%  Weight:      Height:       No intake or output data in the 24 hours ending 08/11/21 0754    08/10/2021    6:13 PM 02/02/2020    9:42 AM 01/27/2020    2:57 PM  Last 3 Weights  Weight (lbs) 122 lb 122 lb 120 lb 9.6 oz  Weight (kg) 55.339 kg 55.339 kg 54.704 kg     Body mass index is 23.83 kg/m.  General: Pleasant, elderly female resting in the bed in no acute distress HEENT:  normal Neck: no JVD Vascular: Radial pulses 2+ bilaterally Cardiac:  normal S1, S2; RRR; grade 2/6 systolic murmur at L/R upper sternal border  Lungs:  clear to auscultation bilaterally, no wheezing, rhonchi or rales  Abd: soft, nontender, no hepatomegaly  Ext: no edema Musculoskeletal:  No deformities, BUE and BLE strength normal and equal Skin: warm and dry  Neuro:  CNs 2-12 intact, no focal abnormalities noted Psych:  Normal affect   EKG:  The EKG was personally reviewed and demonstrates:  Sinus rhythm, HR 75 BPM, no ischemic changes  Telemetry:  Telemetry was personally reviewed and demonstrates:  Sinus rhythm   Relevant CV Studies:  Echocardiogram 08/10/21   Laboratory Data:  High Sensitivity Troponin:   Recent Labs  Lab 08/10/21 1905 08/10/21 2123 08/10/21 2336 08/11/21 0201  TROPONINIHS 18* 31* 36* 30*     Chemistry Recent Labs  Lab 08/10/21 1905 08/10/21 2123 08/11/21 0201  NA 134*  --  138  K 4.0  --  3.4*  CL 102  --  105  CO2 23  --  25  GLUCOSE 93  --  89  BUN 17  --  13  CREATININE 0.76  --  0.73  CALCIUM 9.0  --  8.8*  MG  --  1.6* 3.2*  GFRNONAA >60  --  >60  ANIONGAP 9  --  8    Recent Labs  Lab 08/10/21 2123 08/11/21 0201  PROT 5.2* 6.3*  ALBUMIN 2.8* 3.4*  AST 23 25  ALT 11 12  ALKPHOS 39 46  BILITOT 0.5 0.8   Lipids No results for input(s): "CHOL", "TRIG", "HDL", "LABVLDL", "LDLCALC", "CHOLHDL" in the last 168 hours.  Hematology Recent Labs  Lab 08/10/21 1905 08/11/21 0201  WBC 7.4 6.2  RBC 4.48 4.57  HGB 13.4 13.6  HCT 40.2 41.8  MCV 89.7 91.5  MCH 29.9 29.8  MCHC 33.3 32.5  RDW 13.7 13.8  PLT 256 242   Thyroid  Recent Labs  Lab 08/10/21 2333  TSH 3.396    BNPNo results for input(s): "BNP", "PROBNP" in the last 168  hours.  DDimer  Recent Labs  Lab 08/10/21 2332  DDIMER 0.54*     Radiology/Studies:  DG Chest Portable 1 View  Result Date: 08/10/2021 CLINICAL DATA:  Chest discomfort EXAM: PORTABLE CHEST 1 VIEW  COMPARISON:  Chest x-ray 10/16/2018 FINDINGS: The heart size and mediastinal contours are within normal limits. Both lungs are clear. The visualized skeletal structures are unremarkable. IMPRESSION: No active disease. Electronically Signed   By: Darliss Cheney M.D.   On: 08/10/2021 19:38     Assessment and Plan:   HTN emergency  - Occurring in the setting of medication changes-- patient was instructed to stop taking her BP medications at her cardiology appointment on 7/14 due to dizziness, reportedly resumed them on her own - Does admit to being under a lot of emotional stress since her husband passed away last year and feels like she has "panic attacks". Also admits to eating a lot of soup last week that was high in sodium - Discussed decreasing her sodium intake to help lower BP  - Medication recs per primary  Troponin Elevation - hsTn 18>>31>>36>>30 - Minimal elevation and flat trend, occurring in the setting of HTN emergency with BP elevated to 211/99  - Patient denied having any chest pain, worsening SOB (has had chronic SOB on exertion since she had PNA in 2019), ankle edema, weakness. Only reported feeling anxious and shaky  - EKG without ischemic changes  - Trop elevation is demand ischemia in the setting of severe HTN. Follow up echocardiogram, there is no indication for further cardiology work up at this time    Risk Assessment/Risk Scores:          For questions or updates, please contact [facility] Please consult [email or web address] for contact info under    Signed, Jonita Albee, PA-C  08/11/2021 7:54 AM

## 2021-08-11 NOTE — Discharge Instructions (Signed)
Keep a blood pressure journal and record your blood pressure 1 to 2 times a day. Bring this with you to follow up visit.

## 2021-08-11 NOTE — Assessment & Plan Note (Addendum)
-   on micardis at home (20 mg BID in our med rec but in [place] cardiology med rec is 20 mg daily) which she tapered off she says - given rebound HTN (with some possible attribution from stress/anxiety), will resume on lower dose micardis at discharge and have her follow up with cardiology/PCP - will try micardis 10 mg daily at discharge and inform her to keep a BP log and bring to follow up visit

## 2021-08-11 NOTE — Assessment & Plan Note (Signed)
repleted ?

## 2021-08-11 NOTE — Assessment & Plan Note (Addendum)
-   suspected enzyme leak in setting of uncontrolled BP - EKG reassuring and CP resolved with lowering of BP -Echo also obtained during hospitalization.  EF 65 to 70%, no RWMA.  Mild LVH.  Grade 1 DD

## 2021-08-11 NOTE — Assessment & Plan Note (Addendum)
-   Felt to be in setting of hypertensive urgency/emergency.  Chest pain resolved after blood pressure down trended.  - No ischemic changes on EKG - trops trended (18 >> 31 >>36 >> 30)

## 2021-08-11 NOTE — Hospital Course (Signed)
Mr. Kilcrease is an 81 yo female with PMH HTN, arthritis who presented to the ER with uncontrolled high blood pressure and chest pain along with feeling lightheaded. She recently has weaned off of her micardis at home due to controlled Bps (on the soft side) and symptoms of dizziness.  She states that several days after discontinuation altogether, her blood pressure has rebounded and she presented with blood pressure 211/99.  This remained sustained and she was admitted for blood pressure control and further work-up.

## 2021-08-11 NOTE — Assessment & Plan Note (Signed)
-   Some possible contribution to elevated blood pressure from her underlying stress and anxiety lately.  Her husband passed away 1 year ago in 19-Jul-2022and she has been still mourning.  She states that recently she has been having a harder time given the 1 year anniversary of his passing - offered supportive/empathetic listening; I recommended she discuss this further with her PCP in case of need for further treatment; during my assessment she was observed to be having a panic attack before being able to talk to me

## 2021-08-11 NOTE — Assessment & Plan Note (Addendum)
-   CP, dizziness, and uncontrolled BP s/p weaning of micardis - resumed back on ARB; will try and d/c with lower dose and have her follow up outpt with cardiology/PCP again

## 2021-08-11 NOTE — Progress Notes (Signed)
Echocardiogram 2D Echocardiogram has been performed.  Warren Lacy Kandi Brusseau RDCS 08/11/2021, 12:06 PM

## 2021-08-11 NOTE — Discharge Summary (Signed)
Physician Discharge Summary   Lupita Rosales Every GNO:[id number] DOB: August 21, 1940 DOA: 08/10/2021  PCP: Malka So., MD  Admit date: 08/10/2021 Discharge date: 08/11/2021  Barriers to discharge: none  Admitted From: Home Disposition:  Home Discharging physician: Lewie Chamber, MD  Recommendations for Outpatient Follow-up:  Follow up with cardiology  Home Health:  Equipment/Devices:   Discharge Condition: stable CODE STATUS: DNR Diet recommendation:  Diet Orders (From admission, onward)     Start     Ordered   08/11/21 0000  Diet - low sodium heart healthy        08/11/21 1340   08/10/21 2247  Diet Heart Room service appropriate? Yes; Fluid consistency: Thin  Diet effective now       Question Answer Comment  Room service appropriate? Yes   Fluid consistency: Thin      08/10/21 2246            Hospital Course: Mr. Clingan is an 81 yo female with PMH HTN, arthritis who presented to the ER with uncontrolled high blood pressure and chest pain along with feeling lightheaded. She recently has weaned off of her micardis at home due to controlled Bps (on the soft side) and symptoms of dizziness.  She states that several days after discontinuation altogether, her blood pressure has rebounded and she presented with blood pressure 211/99.  This remained sustained and she was admitted for blood pressure control and further work-up.  Assessment and Plan: * Hypertensive emergency - CP, dizziness, and uncontrolled BP s/p weaning of micardis - resumed back on ARB; will try and d/c with lower dose and have her follow up outpt with cardiology/PCP again   Hypertension - on micardis at home (20 mg BID in our med rec but in [facility]ology med rec is 20 mg daily) which she tapered off she says - given rebound HTN (with some possible attribution from stress/anxiety), will resume on lower dose micardis at discharge and have her follow up with cardiology/PCP - will try micardis 10 mg daily  at discharge and inform her to keep a BP log and bring to follow up visit    Anxiety - Some possible contribution to elevated blood pressure from her underlying stress and anxiety lately.  Her husband passed away 1 year ago in July 31, 2022and she has been still mourning.  She states that recently she has been having a harder time given the 1 year anniversary of his passing - offered supportive/empathetic listening; I recommended she discuss this further with her PCP in case of need for further treatment; during my assessment she was observed to be having a panic attack before being able to talk to me   Chest pain-resolved as of 08/11/2021 - Felt to be in setting of hypertensive urgency/emergency.  Chest pain resolved after blood pressure down trended.  - No ischemic changes on EKG - trops trended (18 >> 31 >>36 >> 30)  Elevated troponin-resolved as of 08/11/2021 - suspected enzyme leak in setting of uncontrolled BP - EKG reassuring and CP resolved with lowering of BP -Echo also obtained during hospitalization.  EF 65 to 70%, no RWMA.  Mild LVH.  Grade 1 DD  Hypomagnesemia - repleted    The patient's chronic medical conditions were treated accordingly per the patient's home medication regimen except as noted.  On day of discharge, patient was felt deemed stable for discharge. Patient/family member advised to call PCP or come back to ER if needed.   Principal Diagnosis: Hypertensive emergency  Discharge Diagnoses: Active Hospital Problems   Diagnosis Date Noted   Hypertensive emergency 04/10/2016    Priority: 1.   Hypertension     Priority: 1.   Anxiety     Priority: 2.   Hypomagnesemia 08/10/2021    Resolved Hospital Problems   Diagnosis Date Noted Date Resolved   Elevated troponin 08/10/2021 08/11/2021    Priority: 3.   Chest pain 08/10/2021 08/11/2021    Priority: 3.     Discharge Instructions     Diet - low sodium heart healthy   Complete by: As directed    Increase  activity slowly   Complete by: As directed       Allergies as of 08/11/2021       Reactions   Sulfa Antibiotics Other (See Comments)   Severe reaction-per family   Amlodipine    Other reaction(s): Redness Flushing of face & neck, followed by generalized weakness   Erythromycin Other (See Comments)   Potential ear troubles/ deafness   Tylenol [acetaminophen] Other (See Comments)   Altered mental status and mental changes   Tylenol [acetaminophen] Other (See Comments)   Pt stated is causes her to have altered mental status    Azithromycin Tinitus   Sulfa Antibiotics Hives        Medication List     STOP taking these medications    ipratropium-albuterol 0.5-2.5 (3) MG/3ML Soln Commonly known as: DUONEB       TAKE these medications    ascorbic acid 500 MG tablet Commonly known as: VITAMIN C Take 500 mg by mouth 2 (two) times daily.   aspirin EC 81 MG tablet Take 1 tablet (81 mg total) by mouth daily.   CALCIUM-VITAMIN D PO Take 1 tablet by mouth 2 (two) times daily.   escitalopram 10 MG tablet Commonly known as: LEXAPRO Take 10 mg by mouth daily.   feeding supplement Liqd Take 237 mLs by mouth 2 (two) times daily between meals.   FLAX SEED OIL PO Take 5 mLs by mouth daily.   glucosamine-chondroitin 500-400 MG tablet Take 1 tablet by mouth 2 (two) times daily.   GNP GINGKO BILOBA EXTRACT PO Take 40 mg by mouth daily.   LUTEIN PO Take 120 mg by mouth daily.   telmisartan 20 MG tablet Commonly known as: MICARDIS Take 0.5 tablets (10 mg total) by mouth daily. What changed:  how much to take when to take this        Follow-up Information     Jobe, Garrison [place]., MD. Schedule an appointment as soon as possible for a visit in 1 week(s).   Specialty: Internal Medicine Contact information: [address] [place] [address] [facility] [place] [address] [phone]                Allergies  Allergen Reactions   Sulfa Antibiotics Other (See  Comments)    Severe reaction-per family   Amlodipine     Other reaction(s): Redness Flushing of face & neck, followed by generalized weakness   Erythromycin Other (See Comments)    Potential ear troubles/ deafness   Tylenol [Acetaminophen] Other (See Comments)    Altered mental status and mental changes   Tylenol [Acetaminophen] Other (See Comments)    Pt stated is causes her to have altered mental status    Azithromycin Tinitus   Sulfa Antibiotics Hives    Consultations: Cardiology  Procedures:   Discharge Exam: BP 138/71 (BP Location: Right Arm)   Pulse 63   Temp 98  F (36.7 C)   Resp 12   Ht 5' (1.524 m)   Wt 55.3 kg   SpO2 97%   BMI 23.83 kg/m  Physical Exam Constitutional:      Comments: Anxious appearing  HENT:     Head: Normocephalic and atraumatic.     Mouth/Throat:     Mouth: Mucous membranes are moist.  Eyes:     Extraocular Movements: Extraocular movements intact.  Cardiovascular:     Rate and Rhythm: Normal rate and regular rhythm.  Pulmonary:     Effort: Pulmonary effort is normal.     Breath sounds: Normal breath sounds.  Abdominal:     General: Bowel sounds are normal. There is no distension.     Palpations: Abdomen is soft.     Tenderness: There is no abdominal tenderness.  Musculoskeletal:        General: Normal range of motion.     Cervical back: Normal range of motion and neck supple.  Skin:    General: Skin is warm and dry.  Neurological:     General: No focal deficit present.     Mental Status: She is alert.  Psychiatric:        Behavior: Behavior normal.      The results of significant diagnostics from this hospitalization (including imaging, microbiology, ancillary and laboratory) are listed below for reference.   Microbiology: No results found for this or any previous visit (from the past 240 hour(s)).   Labs: BNP (last 3 results) No results for input(s): "BNP" in the last 8760 hours. Basic Metabolic Panel: Recent Labs   Lab 08/10/21 1905 08/10/21 2123 08/11/21 0201  NA 134*  --  138  K 4.0  --  3.4*  CL 102  --  105  CO2 23  --  25  GLUCOSE 93  --  89  BUN 17  --  13  CREATININE 0.76  --  0.73  CALCIUM 9.0  --  8.8*  MG  --  1.6* 3.2*  PHOS  --  3.1  --    Liver Function Tests: Recent Labs  Lab 08/10/21 2123 08/11/21 0201  AST 23 25  ALT 11 12  ALKPHOS 39 46  BILITOT 0.5 0.8  PROT 5.2* 6.3*  ALBUMIN 2.8* 3.4*   No results for input(s): "LIPASE", "AMYLASE" in the last 168 hours. No results for input(s): "AMMONIA" in the last 168 hours. CBC: Recent Labs  Lab 08/10/21 1905 08/11/21 0201  WBC 7.4 6.2  NEUTROABS 4.6  --   HGB 13.4 13.6  HCT 40.2 41.8  MCV 89.7 91.5  PLT 256 242   Cardiac Enzymes: Recent Labs  Lab 08/10/21 2123  CKTOTAL 72   BNP: Invalid input(s): "POCBNP" CBG: No results for input(s): "GLUCAP" in the last 168 hours. D-Dimer Recent Labs    08/10/21 2332  DDIMER 0.54*   Hgb A1c No results for input(s): "HGBA1C" in the last 72 hours. Lipid Profile No results for input(s): "CHOL", "HDL", "LDLCALC", "TRIG", "CHOLHDL", "LDLDIRECT" in the last 72 hours. Thyroid function studies Recent Labs    08/10/21 2333  TSH 3.396   Anemia work up No results for input(s): "VITAMINB12", "FOLATE", "FERRITIN", "TIBC", "IRON", "RETICCTPCT" in the last 72 hours. Urinalysis    Component Value Date/Time   COLORURINE YELLOW 12/15/2019 2206   APPEARANCEUR CLEAR 12/15/2019 2206   LABSPEC 1.009 12/15/2019 2206   PHURINE 5.0 12/15/2019 2206   GLUCOSEU NEGATIVE 12/15/2019 2206   HGBUR SMALL (A) 12/15/2019 2206  BILIRUBINUR NEGATIVE 12/15/2019 2206   KETONESUR NEGATIVE 12/15/2019 2206   PROTEINUR NEGATIVE 12/15/2019 2206   UROBILINOGEN 0.2 12/22/2010 2018   NITRITE NEGATIVE 12/15/2019 2206   LEUKOCYTESUR MODERATE (A) 12/15/2019 2206   Sepsis Labs Recent Labs  Lab 08/10/21 1905 08/11/21 0201  WBC 7.4 6.2   Microbiology No results found for this or any previous  visit (from the past 240 hour(s)).  Procedures/Studies: ECHOCARDIOGRAM COMPLETE  Result Date: 08/11/2021    ECHOCARDIOGRAM REPORT   Patient Name:   Rebecca Hurley Date of Exam: 08/11/2021 Medical Rec #:  [id number]          Height:       60.0 in Accession #:    [id number]         Weight:       122.0 lb Date of Birth:  09/23/1940           BSA:          1.513 m Patient Age:    89 years           BP:           138/71 mmHg Patient Gender: F                  HR:           71 bpm. Exam Location:  Inpatient Procedure: 2D Echo, Color Doppler and Cardiac Doppler Indications:    R07.9* Chest pain, unspecified  History:        Patient has prior history of Echocardiogram examinations, most                 recent 04/11/2016. Risk Factors:Hypertension.  Sonographer:    Irving BurtonEmily Senior RDCS Referring Phys: [id number] ANASTASSIA DOUTOVA  Sonographer Comments: Technically difficult due to poor echo windows. IMPRESSIONS  1. Left ventricular ejection fraction, by estimation, is 65 to 70%. The left ventricle has hyperdynamic function. The left ventricle has no regional wall motion abnormalities. There is mild left ventricular hypertrophy. Left ventricular diastolic parameters are consistent with Grade I diastolic dysfunction (impaired relaxation).  2. Right ventricular systolic function is normal. The right ventricular size is normal. Tricuspid regurgitation signal is inadequate for assessing PA pressure.  3. The mitral valve is normal in structure. No evidence of mitral valve regurgitation. No evidence of mitral stenosis.  4. The aortic valve is tricuspid. There is mild calcification of the aortic valve. Aortic valve regurgitation is not visualized. No aortic stenosis is present.  5. The inferior vena cava is normal in size with greater than 50% respiratory variability, suggesting right atrial pressure of 3 mmHg. FINDINGS  Left Ventricle: Left ventricular ejection fraction, by estimation, is 65 to 70%. The left ventricle has hyperdynamic  function. The left ventricle has no regional wall motion abnormalities. The left ventricular internal cavity size was normal in size. There is mild left ventricular hypertrophy. Left ventricular diastolic parameters are consistent with Grade I diastolic dysfunction (impaired relaxation). Right Ventricle: The right ventricular size is normal. No increase in right ventricular wall thickness. Right ventricular systolic function is normal. Tricuspid regurgitation signal is inadequate for assessing PA pressure. Left Atrium: Left atrial size was normal in size. Right Atrium: Right atrial size was normal in size. Pericardium: There is no evidence of pericardial effusion. Mitral Valve: The mitral valve is normal in structure. No evidence of mitral valve regurgitation. No evidence of mitral valve stenosis. Tricuspid Valve: The tricuspid valve is normal in structure. Tricuspid valve regurgitation is trivial.  Aortic Valve: The aortic valve is tricuspid. There is mild calcification of the aortic valve. Aortic valve regurgitation is not visualized. No aortic stenosis is present. Pulmonic Valve: The pulmonic valve was normal in structure. Pulmonic valve regurgitation is not visualized. Aorta: The aortic root is normal in size and structure. Venous: The inferior vena cava is normal in size with greater than 50% respiratory variability, suggesting right atrial pressure of 3 mmHg. IAS/Shunts: No atrial level shunt detected by color flow Doppler.  LEFT VENTRICLE PLAX 2D LVIDd:         2.80 cm   Diastology LVIDs:         1.60 cm   LV e' medial:    5.00 cm/s LV PW:         1.10 cm   LV E/e' medial:  14.8 LV IVS:        1.00 cm   LV e' lateral:   6.64 cm/s LVOT diam:     2.00 cm   LV E/e' lateral: 11.2 LV SV:         74 LV SV Index:   49 LVOT Area:     3.14 cm  RIGHT VENTRICLE RV S prime:     11.50 cm/s TAPSE (M-mode): 2.3 cm LEFT ATRIUM             Index        RIGHT ATRIUM          Index LA diam:        3.10 cm 2.05 cm/m   RA Area:      9.33 cm LA Vol (A2C):   41.2 ml 27.23 ml/m  RA Volume:   17.80 ml 11.77 ml/m LA Vol (A4C):   41.3 ml 27.30 ml/m LA Biplane Vol: 43.2 ml 28.55 ml/m  AORTIC VALVE LVOT Vmax:   119.00 cm/s LVOT Vmean:  87.600 cm/s LVOT VTI:    0.237 m  AORTA Ao Root diam: 2.70 cm Ao Asc diam:  2.90 cm MITRAL VALVE MV Area (PHT): 2.74 cm    SHUNTS MV Decel Time: 277 msec    Systemic VTI:  0.24 m MV E velocity: 74.10 cm/s  Systemic Diam: 2.00 cm MV A velocity: 83.60 cm/s MV E/A ratio:  0.89 Dalton McleanMD Electronically signed by Wilfred Lacy Signature Date/Time: 08/11/2021/3:17:33 PM    Final    DG Chest Portable 1 View  Result Date: 08/10/2021 CLINICAL DATA:  Chest discomfort EXAM: PORTABLE CHEST 1 VIEW COMPARISON:  Chest x-ray 10/16/2018 FINDINGS: The heart size and mediastinal contours are within normal limits. Both lungs are clear. The visualized skeletal structures are unremarkable. IMPRESSION: No active disease. Electronically Signed   By: Darliss Cheney M.D.   On: 08/10/2021 19:38     Time coordinating discharge: Over 30 minutes    Lewie Chamber, MD  [facility] 08/11/2021, 4:10 PM

## 2021-08-11 NOTE — Progress Notes (Signed)
Transition of Care (TOC) Screening Note  Patient Details  Name: Rebecca Hurley Date of Birth: 04-23-1940  Transition of Care [facility]) CM/SW Contact:    Ewing Schlein, LCSW Phone Number: 08/11/2021, 1:26 PM  Transition of Care Department [facility]) has reviewed patient and no TOC needs have been identified at this time. We will continue to monitor patient advancement through interdisciplinary progression rounds. If new patient transition needs arise, please place a TOC consult.

## 2021-08-24 ENCOUNTER — Encounter (HOSPITAL_COMMUNITY): Payer: Self-pay

## 2021-08-24 ENCOUNTER — Inpatient Hospital Stay (HOSPITAL_COMMUNITY)
Admission: EM | Admit: 2021-08-24 | Discharge: 2021-09-05 | DRG: 329 | Disposition: A | Discharge status: Skilled Nursing Facility | Payer: Medicare Other | Attending: Internal Medicine | Admitting: Internal Medicine

## 2021-08-24 ENCOUNTER — Other Ambulatory Visit: Payer: Self-pay

## 2021-08-24 DIAGNOSIS — R110 Nausea: Secondary | ICD-10-CM

## 2021-08-24 DIAGNOSIS — F419 Anxiety disorder, unspecified: Secondary | ICD-10-CM | POA: Diagnosis present

## 2021-08-24 DIAGNOSIS — Z9049 Acquired absence of other specified parts of digestive tract: Secondary | ICD-10-CM

## 2021-08-24 DIAGNOSIS — Z882 Allergy status to sulfonamides status: Secondary | ICD-10-CM

## 2021-08-24 DIAGNOSIS — Z886 Allergy status to analgesic agent status: Secondary | ICD-10-CM

## 2021-08-24 DIAGNOSIS — B9620 Unspecified Escherichia coli [E. coli] as the cause of diseases classified elsewhere: Secondary | ICD-10-CM | POA: Diagnosis present

## 2021-08-24 DIAGNOSIS — Z888 Allergy status to other drugs, medicaments and biological substances status: Secondary | ICD-10-CM

## 2021-08-24 DIAGNOSIS — K56609 Unspecified intestinal obstruction, unspecified as to partial versus complete obstruction: Secondary | ICD-10-CM | POA: Diagnosis not present

## 2021-08-24 DIAGNOSIS — D649 Anemia, unspecified: Secondary | ICD-10-CM | POA: Diagnosis present

## 2021-08-24 DIAGNOSIS — E876 Hypokalemia: Secondary | ICD-10-CM | POA: Diagnosis not present

## 2021-08-24 DIAGNOSIS — I4719 Other supraventricular tachycardia: Secondary | ICD-10-CM

## 2021-08-24 DIAGNOSIS — Z79899 Other long term (current) drug therapy: Secondary | ICD-10-CM

## 2021-08-24 DIAGNOSIS — M1990 Unspecified osteoarthritis, unspecified site: Secondary | ICD-10-CM | POA: Diagnosis present

## 2021-08-24 DIAGNOSIS — E8809 Other disorders of plasma-protein metabolism, not elsewhere classified: Secondary | ICD-10-CM | POA: Diagnosis not present

## 2021-08-24 DIAGNOSIS — Z7982 Long term (current) use of aspirin: Secondary | ICD-10-CM

## 2021-08-24 DIAGNOSIS — Z66 Do not resuscitate: Secondary | ICD-10-CM | POA: Diagnosis present

## 2021-08-24 DIAGNOSIS — R1084 Generalized abdominal pain: Secondary | ICD-10-CM

## 2021-08-24 DIAGNOSIS — F32A Depression, unspecified: Secondary | ICD-10-CM

## 2021-08-24 DIAGNOSIS — I10 Essential (primary) hypertension: Secondary | ICD-10-CM

## 2021-08-24 DIAGNOSIS — K565 Intestinal adhesions [bands], unspecified as to partial versus complete obstruction: Principal | ICD-10-CM | POA: Diagnosis present

## 2021-08-24 DIAGNOSIS — N390 Urinary tract infection, site not specified: Secondary | ICD-10-CM

## 2021-08-24 DIAGNOSIS — M79606 Pain in leg, unspecified: Secondary | ICD-10-CM | POA: Diagnosis not present

## 2021-08-24 DIAGNOSIS — K9189 Other postprocedural complications and disorders of digestive system: Secondary | ICD-10-CM | POA: Diagnosis not present

## 2021-08-24 DIAGNOSIS — Z881 Allergy status to other antibiotic agents status: Secondary | ICD-10-CM

## 2021-08-24 DIAGNOSIS — K567 Ileus, unspecified: Secondary | ICD-10-CM | POA: Diagnosis not present

## 2021-08-24 DIAGNOSIS — R42 Dizziness and giddiness: Secondary | ICD-10-CM | POA: Diagnosis not present

## 2021-08-24 DIAGNOSIS — H9190 Unspecified hearing loss, unspecified ear: Secondary | ICD-10-CM | POA: Diagnosis present

## 2021-08-24 DIAGNOSIS — K432 Incisional hernia without obstruction or gangrene: Secondary | ICD-10-CM | POA: Diagnosis present

## 2021-08-24 DIAGNOSIS — I471 Supraventricular tachycardia: Secondary | ICD-10-CM | POA: Diagnosis present

## 2021-08-24 DIAGNOSIS — Z1624 Resistance to multiple antibiotics: Secondary | ICD-10-CM | POA: Diagnosis present

## 2021-08-24 DIAGNOSIS — E871 Hypo-osmolality and hyponatremia: Secondary | ICD-10-CM | POA: Diagnosis not present

## 2021-08-24 DIAGNOSIS — K55021 Focal (segmental) acute infarction of small intestine: Secondary | ICD-10-CM | POA: Diagnosis present

## 2021-08-24 LAB — CBC WITH DIFFERENTIAL/PLATELET
Abs Immature Granulocytes: 0.04 10*3/uL (ref 0.00–0.07)
Basophils Absolute: 0 10*3/uL (ref 0.0–0.1)
Basophils Relative: 0 %
Eosinophils Absolute: 0 10*3/uL (ref 0.0–0.5)
Eosinophils Relative: 0 %
HCT: 45.5 % (ref 36.0–46.0)
Hemoglobin: 15.5 g/dL — ABNORMAL HIGH (ref 12.0–15.0)
Immature Granulocytes: 0 %
Lymphocytes Relative: 11 %
Lymphs Abs: 1 10*3/uL (ref 0.7–4.0)
MCH: 30.4 pg (ref 26.0–34.0)
MCHC: 34.1 g/dL (ref 30.0–36.0)
MCV: 89.2 fL (ref 80.0–100.0)
Monocytes Absolute: 0.6 10*3/uL (ref 0.1–1.0)
Monocytes Relative: 6 %
Neutro Abs: 8 10*3/uL — ABNORMAL HIGH (ref 1.7–7.7)
Neutrophils Relative %: 83 %
Platelets: 289 10*3/uL (ref 150–400)
RBC: 5.1 MIL/uL (ref 3.87–5.11)
RDW: 13.2 % (ref 11.5–15.5)
WBC: 9.6 10*3/uL (ref 4.0–10.5)
nRBC: 0 % (ref 0.0–0.2)

## 2021-08-24 LAB — COMPREHENSIVE METABOLIC PANEL
ALT: 18 U/L (ref 0–44)
AST: 29 U/L (ref 15–41)
Albumin: 4 g/dL (ref 3.5–5.0)
Alkaline Phosphatase: 65 U/L (ref 38–126)
Anion gap: 14 (ref 5–15)
BUN: 13 mg/dL (ref 8–23)
CO2: 25 mmol/L (ref 22–32)
Calcium: 10.1 mg/dL (ref 8.9–10.3)
Chloride: 98 mmol/L (ref 98–111)
Creatinine, Ser: 0.92 mg/dL (ref 0.44–1.00)
GFR, Estimated: >60 mL/min (ref >60)
Glucose, Bld: 117 mg/dL — ABNORMAL HIGH (ref 70–99)
Potassium: 3.6 mmol/L (ref 3.5–5.1)
Sodium: 137 mmol/L (ref 135–145)
Total Bilirubin: 0.9 mg/dL (ref 0.3–1.2)
Total Protein: 7.2 g/dL (ref 6.5–8.1)

## 2021-08-24 LAB — LACTIC ACID, PLASMA: Lactic Acid, Venous: 1.2 mmol/L (ref 0.5–1.9)

## 2021-08-24 LAB — LIPASE, BLOOD: Lipase: 32 U/L (ref 11–51)

## 2021-08-24 MED ORDER — SODIUM CHLORIDE 0.9 % IV BOLUS
500.0000 mL | Freq: Once | INTRAVENOUS | Status: AC
  Administered 2021-08-24: 500 mL via INTRAVENOUS

## 2021-08-24 MED ORDER — FENTANYL CITRATE PF 50 MCG/ML IJ SOSY
50.0000 ug | PREFILLED_SYRINGE | Freq: Once | INTRAMUSCULAR | Status: AC
  Administered 2021-08-25: 50 ug via INTRAVENOUS
  Filled 2021-08-24: qty 1

## 2021-08-24 MED ORDER — ONDANSETRON HCL 4 MG/2ML IJ SOLN
4.0000 mg | Freq: Once | INTRAMUSCULAR | Status: AC
  Administered 2021-08-24: 4 mg via INTRAVENOUS
  Filled 2021-08-24: qty 2

## 2021-08-24 MED ORDER — FENTANYL CITRATE PF 50 MCG/ML IJ SOSY
50.0000 ug | PREFILLED_SYRINGE | Freq: Once | INTRAMUSCULAR | Status: AC
  Administered 2021-08-24: 50 ug via INTRAVENOUS
  Filled 2021-08-24: qty 1

## 2021-08-24 NOTE — ED Provider Notes (Signed)
Rebecca Hurley is a 81 y.o. female.  The history is provided by the patient and medical records. No language interpreter was used.  Abdominal Pain Pain location:  Generalized Pain quality: aching   Pain radiates to:  Does not radiate Pain severity:  Severe Onset quality:  Gradual Duration:  1 day Timing:  Constant Progression:  Waxing and waning Chronicity:  New Relieved by:  Nothing Worsened by:  Palpation Ineffective treatments:  None tried Associated symptoms: constipation and nausea   Associated symptoms: no chest pain, no chills, no cough, no diarrhea, no dysuria, no fatigue, no fever, no flatus, no hematemesis, no hematochezia, no hematuria, no shortness of breath and no vomiting        Home Medications Prior to Admission medications   Medication Sig Start Date End Date Taking? Authorizing Provider  aspirin EC 81 MG tablet Take 1 tablet (81 mg total) by mouth daily. 04/11/16   Rodolph Bong, MD  CALCIUM-VITAMIN D PO Take 1 tablet by mouth 2 (two) times daily.    [provider]  escitalopram (LEXAPRO) 10 MG tablet Take 10 mg by mouth daily. 07/15/21   [provider]  feeding supplement, ENSURE ENLIVE, (ENSURE ENLIVE) LIQD Take 237 mLs by mouth 2 (two) times daily between meals. 10/23/18   Meuth, Brooke A, PA-C  Flaxseed, Linseed, (FLAX SEED OIL PO) Take 5 mLs by mouth daily.    [provider]  Ginkgo Biloba (GNP GINGKO BILOBA EXTRACT PO) Take 40 mg by mouth daily.    [provider]  glucosamine-chondroitin 500-400 MG tablet Take 1 tablet by mouth 2 (two) times daily.    [provider]  LUTEIN PO Take 120 mg by mouth daily.    [provider]  telmisartan (MICARDIS) 20 MG tablet Take 0.5 tablets (10 mg total) by mouth daily. 08/11/21   Lewie Chamber,  MD  vitamin C (ASCORBIC ACID) 500 MG tablet Take 500 mg by mouth 2 (two) times daily.    [provider]      Allergies    Sulfa antibiotics, Amlodipine, Erythromycin, Tylenol [acetaminophen], Tylenol [acetaminophen], Azithromycin, and Sulfa antibiotics    Review of Systems   Review of Systems  Constitutional:  Negative for chills, fatigue and fever.  HENT:  Negative for congestion.   Respiratory:  Negative for cough, chest tightness and shortness of breath.   Cardiovascular:  Negative for chest pain and palpitations.  Gastrointestinal:  Positive for abdominal distention, abdominal pain, constipation and nausea. Negative for blood in stool, diarrhea, flatus, hematemesis, hematochezia and vomiting.  Genitourinary:  Negative for dysuria, flank pain and hematuria.  Musculoskeletal:  Negative for back pain, neck pain and neck stiffness.  Skin:  Negative for rash and wound.  Neurological:  Negative for dizziness, weakness, light-headedness and headaches.  Psychiatric/Behavioral:  Negative for agitation and confusion.   All other systems reviewed and are negative.   Physical Exam Updated Vital Signs BP (!) 175/94   Pulse (!) 127   Temp 97.8 F (36.6 C) (Oral)   Resp 20   Ht 5' (1.524 m)   Wt 56.7 kg   SpO2 100%   BMI 24.41 kg/m  Physical Exam Vitals and nursing note reviewed.  Constitutional:      General: She is not in acute distress.    Appearance:  She is well-developed. She is not ill-appearing, toxic-appearing or diaphoretic.  HENT:     Head: Normocephalic and atraumatic.     Nose: No congestion or rhinorrhea.     Mouth/Throat:     Mouth: Mucous membranes are moist.     Pharynx: No oropharyngeal exudate or posterior oropharyngeal erythema.  Eyes:     Extraocular Movements: Extraocular movements intact.     Conjunctiva/sclera: Conjunctivae normal.     Pupils: Pupils are equal, round, and reactive to light.  Cardiovascular:     Rate and Rhythm: Normal rate and  regular rhythm.     Heart sounds: No murmur heard. Pulmonary:     Effort: Pulmonary effort is normal. No respiratory distress.     Breath sounds: Normal breath sounds. No wheezing, rhonchi or rales.  Chest:     Chest wall: No tenderness.  Abdominal:     General: There is distension.     Palpations: Abdomen is soft.     Tenderness: There is abdominal tenderness. There is no guarding or rebound.  Musculoskeletal:        General: No swelling or tenderness.     Cervical back: Neck supple.  Skin:    General: Skin is warm and dry.     Capillary Refill: Capillary refill takes less than 2 seconds.     Findings: No erythema or rash.  Neurological:     General: No focal deficit present.     Mental Status: She is alert.  Psychiatric:        Mood and Affect: Mood normal.     ED Results / Procedures / Treatments   Labs (all labs ordered are listed, but only abnormal results are displayed) Labs Reviewed  CBC WITH DIFFERENTIAL/PLATELET - Abnormal; Notable for the following components:      Result Value   Hemoglobin 15.5 (*)    Neutro Abs 8.0 (*)    All other components within normal limits  COMPREHENSIVE METABOLIC PANEL - Abnormal; Notable for the following components:   Glucose, Bld 117 (*)    All other components within normal limits  URINE CULTURE  LACTIC ACID, PLASMA  LIPASE, BLOOD  LACTIC ACID, PLASMA  URINALYSIS, ROUTINE W REFLEX MICROSCOPIC    EKG None  Radiology No results found.  Procedures Procedures    Medications Ordered in ED Medications  fentaNYL (SUBLIMAZE) injection 50 mcg (has no administration in time range)  sodium chloride 0.9 % bolus 500 mL (500 mLs Intravenous New Bag/Given 08/24/21 2256)  fentaNYL (SUBLIMAZE) injection 50 mcg (50 mcg Intravenous Given 08/24/21 2251)  ondansetron (ZOFRAN) injection 4 mg (4 mg Intravenous Given 08/24/21 2250)    ED Course/ Medical Decision Making/ A&P                           Medical Decision Making Amount and/or  Complexity of Data Reviewed Labs: ordered. Radiology: ordered.  Risk Prescription drug management.    Rebecca Hurley is a 81 y.o. female with a past medical history significant for hypertension and previous bowel resection and abdominal surgery from [facility] who presents for abdominal pain.  According to patient, she was doing well until this morning after a bowel movement when she had onset of pain.  She reports the pain was a 10 out of 10 and was all over her abdomen.  She feels it is distended and bloated and she has not passed gas since.  Has not a bowel movement since.  She reports normal urination but has had some nausea.  No vomiting.  She reports he has not had as much eat or drink today due to the nausea.  She reports no fevers, chills, chest pain, or shortness of breath.  Denies any trauma.  On exam, lungs clear and chest nontender.  Abdomen is diffusely tender and slightly distended.  Surgical scar present.  Did not appreciate bowel sounds.  Patient moving all extremities.  Given her history of abdominal surgery and her symptoms I am concerned about bowel obstruction.  We will get CT imaging and labs.  We will give her pain medicine, nausea medicine, and some fluids.   Anticipate reassessment after imaging is completed to rule out bowel obstruction.  Care transferred to oncoming team to await results of CT imaging and reassessment.         Final Clinical Impression(s) / ED Diagnoses Final diagnoses:  Generalized abdominal pain  Nausea    Clinical Impression: 1. Generalized abdominal pain   2. Nausea     Disposition: Care transferred to oncoming team to await for results of CT imaging of the abdomen to rule out bowel obstruction.  Anticipate disposition based on findings.  This note was prepared with assistance of Conservation officer, historic buildings. Occasional wrong-word or sound-a-like substitutions may have occurred due to the inherent limitations of voice recognition  software.      Braylen Staller, Canary Brim, MD 08/25/21 [phone]

## 2021-08-24 NOTE — ED Triage Notes (Signed)
Pt BIB EMS for ABD pain that started today. Pt endorses nausea, but denies vomiting. Pt is HOH.

## 2021-08-25 ENCOUNTER — Inpatient Hospital Stay (HOSPITAL_COMMUNITY): Payer: Medicare Other

## 2021-08-25 ENCOUNTER — Encounter (HOSPITAL_COMMUNITY): Payer: Self-pay

## 2021-08-25 ENCOUNTER — Emergency Department (HOSPITAL_COMMUNITY): Payer: Medicare Other

## 2021-08-25 DIAGNOSIS — K55021 Focal (segmental) acute infarction of small intestine: Secondary | ICD-10-CM | POA: Diagnosis present

## 2021-08-25 DIAGNOSIS — F32A Depression, unspecified: Secondary | ICD-10-CM

## 2021-08-25 DIAGNOSIS — R42 Dizziness and giddiness: Secondary | ICD-10-CM | POA: Diagnosis not present

## 2021-08-25 DIAGNOSIS — K567 Ileus, unspecified: Secondary | ICD-10-CM | POA: Diagnosis not present

## 2021-08-25 DIAGNOSIS — K56609 Unspecified intestinal obstruction, unspecified as to partial versus complete obstruction: Secondary | ICD-10-CM

## 2021-08-25 DIAGNOSIS — F418 Other specified anxiety disorders: Secondary | ICD-10-CM | POA: Diagnosis not present

## 2021-08-25 DIAGNOSIS — K432 Incisional hernia without obstruction or gangrene: Secondary | ICD-10-CM | POA: Diagnosis present

## 2021-08-25 DIAGNOSIS — E8809 Other disorders of plasma-protein metabolism, not elsewhere classified: Secondary | ICD-10-CM | POA: Diagnosis not present

## 2021-08-25 DIAGNOSIS — Z1624 Resistance to multiple antibiotics: Secondary | ICD-10-CM | POA: Diagnosis present

## 2021-08-25 DIAGNOSIS — E871 Hypo-osmolality and hyponatremia: Secondary | ICD-10-CM | POA: Diagnosis not present

## 2021-08-25 DIAGNOSIS — I471 Supraventricular tachycardia: Secondary | ICD-10-CM | POA: Diagnosis present

## 2021-08-25 DIAGNOSIS — M7989 Other specified soft tissue disorders: Secondary | ICD-10-CM | POA: Diagnosis not present

## 2021-08-25 DIAGNOSIS — F419 Anxiety disorder, unspecified: Secondary | ICD-10-CM | POA: Diagnosis present

## 2021-08-25 DIAGNOSIS — K9189 Other postprocedural complications and disorders of digestive system: Secondary | ICD-10-CM | POA: Diagnosis not present

## 2021-08-25 DIAGNOSIS — E876 Hypokalemia: Secondary | ICD-10-CM | POA: Diagnosis not present

## 2021-08-25 DIAGNOSIS — D649 Anemia, unspecified: Secondary | ICD-10-CM | POA: Diagnosis present

## 2021-08-25 DIAGNOSIS — N390 Urinary tract infection, site not specified: Secondary | ICD-10-CM | POA: Diagnosis present

## 2021-08-25 DIAGNOSIS — I159 Secondary hypertension, unspecified: Secondary | ICD-10-CM | POA: Diagnosis not present

## 2021-08-25 DIAGNOSIS — K565 Intestinal adhesions [bands], unspecified as to partial versus complete obstruction: Secondary | ICD-10-CM | POA: Diagnosis present

## 2021-08-25 DIAGNOSIS — I10 Essential (primary) hypertension: Secondary | ICD-10-CM | POA: Diagnosis present

## 2021-08-25 DIAGNOSIS — Z66 Do not resuscitate: Secondary | ICD-10-CM | POA: Diagnosis present

## 2021-08-25 DIAGNOSIS — M1990 Unspecified osteoarthritis, unspecified site: Secondary | ICD-10-CM | POA: Diagnosis present

## 2021-08-25 DIAGNOSIS — H9190 Unspecified hearing loss, unspecified ear: Secondary | ICD-10-CM | POA: Diagnosis present

## 2021-08-25 DIAGNOSIS — M79606 Pain in leg, unspecified: Secondary | ICD-10-CM | POA: Diagnosis not present

## 2021-08-25 DIAGNOSIS — Z7982 Long term (current) use of aspirin: Secondary | ICD-10-CM | POA: Diagnosis not present

## 2021-08-25 DIAGNOSIS — B9620 Unspecified Escherichia coli [E. coli] as the cause of diseases classified elsewhere: Secondary | ICD-10-CM | POA: Diagnosis present

## 2021-08-25 DIAGNOSIS — Z79899 Other long term (current) drug therapy: Secondary | ICD-10-CM | POA: Diagnosis not present

## 2021-08-25 HISTORY — DX: Depression, unspecified: F32.A

## 2021-08-25 LAB — URINALYSIS, ROUTINE W REFLEX MICROSCOPIC
Bilirubin Urine: NEGATIVE
Glucose, UA: NEGATIVE mg/dL
Ketones, ur: 20 mg/dL — AB
Nitrite: NEGATIVE
Protein, ur: NEGATIVE mg/dL
Specific Gravity, Urine: 1.012 (ref 1.005–1.030)
pH: 7 (ref 5.0–8.0)

## 2021-08-25 LAB — CBC
HCT: 41 % (ref 36.0–46.0)
Hemoglobin: 13.6 g/dL (ref 12.0–15.0)
MCH: 30.2 pg (ref 26.0–34.0)
MCHC: 33.2 g/dL (ref 30.0–36.0)
MCV: 90.9 fL (ref 80.0–100.0)
Platelets: 248 10*3/uL (ref 150–400)
RBC: 4.51 MIL/uL (ref 3.87–5.11)
RDW: 13.4 % (ref 11.5–15.5)
WBC: 10.3 10*3/uL (ref 4.0–10.5)
nRBC: 0 % (ref 0.0–0.2)

## 2021-08-25 LAB — BASIC METABOLIC PANEL
Anion gap: 10 (ref 5–15)
BUN: 13 mg/dL (ref 8–23)
CO2: 25 mmol/L (ref 22–32)
Calcium: 9.2 mg/dL (ref 8.9–10.3)
Chloride: 104 mmol/L (ref 98–111)
Creatinine, Ser: 0.92 mg/dL (ref 0.44–1.00)
GFR, Estimated: >60 mL/min (ref >60)
Glucose, Bld: 137 mg/dL — ABNORMAL HIGH (ref 70–99)
Potassium: 4.5 mmol/L (ref 3.5–5.1)
Sodium: 139 mmol/L (ref 135–145)

## 2021-08-25 LAB — MAGNESIUM: Magnesium: 2 mg/dL (ref 1.7–2.4)

## 2021-08-25 MED ORDER — SODIUM CHLORIDE 0.9 % IV SOLN
1.0000 g | INTRAVENOUS | Status: DC
  Administered 2021-08-26 – 2021-08-27 (×2): 1 g via INTRAVENOUS
  Filled 2021-08-25 (×2): qty 10

## 2021-08-25 MED ORDER — DIATRIZOATE MEGLUMINE & SODIUM 66-10 % PO SOLN
90.0000 mL | Freq: Once | ORAL | Status: AC
Start: 2021-08-25 — End: 2021-08-25
  Administered 2021-08-25: 90 mL via NASOGASTRIC
  Filled 2021-08-25: qty 90

## 2021-08-25 MED ORDER — SODIUM CHLORIDE 0.9 % IV SOLN: INTRAVENOUS | Status: AC

## 2021-08-25 MED ORDER — IOHEXOL 300 MG/ML  SOLN
100.0000 mL | Freq: Once | INTRAMUSCULAR | Status: AC | PRN
  Administered 2021-08-25: 100 mL via INTRAVENOUS

## 2021-08-25 MED ORDER — SODIUM CHLORIDE 0.9 % IV SOLN
1.0000 g | Freq: Once | INTRAVENOUS | Status: AC
  Administered 2021-08-25: 1 g via INTRAVENOUS
  Filled 2021-08-25: qty 10

## 2021-08-25 MED ORDER — HYDRALAZINE HCL 20 MG/ML IJ SOLN
5.0000 mg | Freq: Four times a day (QID) | INTRAMUSCULAR | Status: DC | PRN
  Administered 2021-08-25 – 2021-08-28 (×5): 5 mg via INTRAVENOUS
  Filled 2021-08-25 (×5): qty 1

## 2021-08-25 MED ORDER — ONDANSETRON HCL 4 MG/2ML IJ SOLN
4.0000 mg | Freq: Four times a day (QID) | INTRAMUSCULAR | Status: DC | PRN
  Administered 2021-08-25 – 2021-08-28 (×11): 4 mg via INTRAVENOUS
  Filled 2021-08-25 (×13): qty 2

## 2021-08-25 MED ORDER — MORPHINE SULFATE (PF) 2 MG/ML IV SOLN
1.0000 mg | INTRAVENOUS | Status: DC | PRN
  Administered 2021-08-25 – 2021-08-28 (×16): 1 mg via INTRAVENOUS
  Filled 2021-08-25 (×16): qty 1

## 2021-08-25 MED ORDER — SODIUM CHLORIDE (PF) 0.9 % IJ SOLN
INTRAMUSCULAR | Status: AC
  Filled 2021-08-25: qty 50

## 2021-08-25 MED ORDER — METOPROLOL TARTRATE 5 MG/5ML IV SOLN
2.5000 mg | Freq: Three times a day (TID) | INTRAVENOUS | Status: DC
  Administered 2021-08-25 – 2021-09-05 (×33): 2.5 mg via INTRAVENOUS
  Filled 2021-08-25 (×33): qty 5

## 2021-08-25 NOTE — Progress Notes (Signed)
Progress Note     Subjective: Still with abdominal pain but feeling better after NG placement. No further emesis. denies flatus   Objective: Vital signs in last 24 hours: Temp:  [97.8 F (36.6 C)-99.1 F (37.3 C)] 98.8 F (37.1 C) (08/18 1058) Pulse Rate:  [64-131] 65 (08/18 1030) Resp:  [16-28] 24 (08/18 1030) BP: (134-175)/(68-103) 148/68 (08/18 1030) SpO2:  [94 %-100 %] 96 % (08/18 1030) Weight:  [56.7 kg] 56.7 kg (08/17 2226)    Intake/Output from previous day: 08/17 0701 - 08/18 0700 In: 500 [IV Piggyback:500] Out: -  Intake/Output this shift: Total I/O In: -  Out: 800 [Emesis/NG output:800]  PE: General: pleasant, WD, female who is laying in bed in NAD Abd: soft, hypoactive BS. Mild TTP epigastrium without peritonitis. Well healed midline surgical scar. NGT with bilious output MSK: all 4 extremities are symmetrical with no cyanosis, clubbing, or edema. Skin: warm and dry with no masses, lesions, or rashes Psych: A&Ox3 with an appropriate affect.    Lab Results:  Recent Labs    08/24/21 2250 08/25/21 0502  WBC 9.6 10.3  HGB 15.5* 13.6  HCT 45.5 41.0  PLT 289 248   BMET Recent Labs    08/24/21 2250 08/25/21 0502  NA 137 139  K 3.6 4.5  CL 98 104  CO2 25 25  GLUCOSE 117* 137*  BUN 13 13  CREATININE 0.92 0.92  CALCIUM 10.1 9.2   PT/INR No results for input(s): "LABPROT", "INR" in the last 72 hours. CMP     Component Value Date/Time   NA 139 08/25/2021 0502   K 4.5 08/25/2021 0502   CL 104 08/25/2021 0502   CO2 25 08/25/2021 0502   GLUCOSE 137 (H) 08/25/2021 0502   BUN 13 08/25/2021 0502   CREATININE 0.92 08/25/2021 0502   CALCIUM 9.2 08/25/2021 0502   PROT 7.2 08/24/2021 2250   ALBUMIN 4.0 08/24/2021 2250   AST 29 08/24/2021 2250   ALT 18 08/24/2021 2250   ALKPHOS 65 08/24/2021 2250   BILITOT 0.9 08/24/2021 2250   GFRNONAA >60 08/25/2021 0502   GFRAA >60 10/21/2018 0612   Lipase     Component Value Date/Time   LIPASE 32  08/24/2021 2250       Studies/Results: DG Abdomen 1 View  Result Date: 08/25/2021 CLINICAL DATA:  Check gastric catheter placement EXAM: ABDOMEN - 1 VIEW COMPARISON:  None Available. FINDINGS: Scattered large and small bowel gas is noted. Small-bowel dilatation is noted consistent with a degree of small-bowel obstruction this is consistent with the prior CT. Contrast material is noted in the bladder and collecting systems consistent with the recent CT. Gastric catheter is noted within the stomach. Degenerative changes of lumbar spine are noted. IMPRESSION: Gastric catheter within the stomach. Persistent small bowel dilatation is noted. Electronically Signed   By: Alcide Clever M.D.   On: 08/25/2021 02:32   CT ABDOMEN PELVIS W CONTRAST  Addendum Date: 08/25/2021   ADDENDUM REPORT: 08/25/2021 01:29 ADDENDUM: Critical findings were reported to Dr. Nicanor Alcon at 1:29 a.m. Electronically Signed   By: Thornell Sartorius M.D.   On: 08/25/2021 01:29   Result Date: 08/25/2021 CLINICAL DATA:  Abdominal pain, nausea, and abdominal distension. Bowel obstruction suspected. EXAM: CT ABDOMEN AND PELVIS WITH CONTRAST TECHNIQUE: Multidetector CT imaging of the abdomen and pelvis was performed using the standard protocol following bolus administration of intravenous contrast. RADIATION DOSE REDUCTION: This exam was performed according to the departmental dose-optimization program which includes automated exposure  control, adjustment of the mA and/or kV according to patient size and/or use of iterative reconstruction technique. CONTRAST:  OMNIPAQUE IOHEXOL 300 MG/ML  SOLN COMPARISON:  None Available. FINDINGS: Lower chest: The heart is enlarged. Mild atelectasis is present at the lung bases. Hepatobiliary: There is a cyst in the left lobe of the liver measuring 7 mm. No biliary ductal dilatation. The gallbladder is without stones. Pancreas: Unremarkable. No pancreatic ductal dilatation or surrounding inflammatory changes.  Spleen: Normal in size without focal abnormality. Adrenals/Urinary Tract: No adrenal nodule or mass. The kidneys enhance symmetrically. No renal calculus or hydronephrosis. The bladder is unremarkable. Stomach/Bowel: The stomach is distended with fluid. There is a distended loop of small bowel in the mid right abdomen an anastomotic site measuring 7.3 cm in diameter. There are multiple loops of distended small bowel in the abdomen measuring up to 3.5 cm. A transition point is present in the right lower quadrant, best seen on coronal image 34 and axial image 51. No free air or pneumatosis. Scattered diverticula are present along the colon without evidence of diverticulitis. The appendix is not visualized on exam. There is a right lower quadrant ventral abdominal wall hernia containing nonobstructed small bowel. Vascular/Lymphatic: Aortic atherosclerosis. No enlarged abdominal or pelvic lymph nodes. Reproductive: Uterus and bilateral adnexa are unremarkable. Other: Mesenteric edema and a small amount of free fluid is noted in the right lower quadrant. Musculoskeletal: Degenerative changes in the thoracolumbar spine. No acute osseous abnormality. IMPRESSION: 1. Multiple distended loops of small bowel in the abdomen measuring up to 3.5 cm. A patulous loop of small bowel and anastomotic site measures up to 7.3 cm. A transition point is present in the right lower quadrant, compatible with small-bowel obstruction. Surgical consultation is recommended. 2. Right lower quadrant ventral abdominal wall hernia containing nonobstructed small bowel. 3. Diverticulosis without diverticulitis. 4. Hepatic cyst. 5. Aortic atherosclerosis. Electronically Signed: By: Thornell Sartorius M.D. On: 08/25/2021 01:25    Anti-infectives: Anti-infectives (From admission, onward)    Start     Dose/Rate Route Frequency Ordered Stop   08/26/21 0200  cefTRIAXone (ROCEPHIN) 1 g in sodium chloride 0.9 % 100 mL IVPB        1 g 200 mL/hr over 30  Minutes Intravenous Every 24 hours 08/25/21 0324     08/25/21 0115  cefTRIAXone (ROCEPHIN) 1 g in sodium chloride 0.9 % 100 mL IVPB        1 g 200 mL/hr over 30 Minutes Intravenous  Once 08/25/21 0114 08/25/21 0333        Assessment/Plan  SBO - history of exploratory laparotomy and open appendectomy - No current indication for emergency surgery - NGT for decompression and keep NPO - SBO protocol - Keep K > 4 and Mg > 2 for bowel function - Mobilize for bowel function - Hopefully patient will improve with conservative management. If patient fails to improve with conservative management, she may require exploratory surgery during admission    FEN: NPO, IVF ID: [email or web address] (UTI) VTE: okay for prophylaxis from surgical perspective   LOS: 0 days   Eric Form, [facility] 08/25/2021, 11:35 AM Please see Amion for pager number during day hours 7:00am-4:30pm
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
## 2021-08-25 NOTE — ED Provider Notes (Signed)
115 Dr. Maisie Fus of general surgery will consult please admit to medicine   [facility] consult placed   Hyden Soley, MD 08/25/21 1308

## 2021-08-26 DIAGNOSIS — N390 Urinary tract infection, site not specified: Secondary | ICD-10-CM

## 2021-08-26 DIAGNOSIS — I159 Secondary hypertension, unspecified: Secondary | ICD-10-CM | POA: Diagnosis not present

## 2021-08-26 DIAGNOSIS — K56609 Unspecified intestinal obstruction, unspecified as to partial versus complete obstruction: Secondary | ICD-10-CM | POA: Diagnosis not present

## 2021-08-26 LAB — COMPREHENSIVE METABOLIC PANEL
ALT: 12 U/L (ref 0–44)
AST: 22 U/L (ref 15–41)
Albumin: 3 g/dL — ABNORMAL LOW (ref 3.5–5.0)
Alkaline Phosphatase: 44 U/L (ref 38–126)
Anion gap: 5 (ref 5–15)
BUN: 16 mg/dL (ref 8–23)
CO2: 28 mmol/L (ref 22–32)
Calcium: 9 mg/dL (ref 8.9–10.3)
Chloride: 111 mmol/L (ref 98–111)
Creatinine, Ser: 0.83 mg/dL (ref 0.44–1.00)
GFR, Estimated: >60 mL/min (ref >60)
Glucose, Bld: 111 mg/dL — ABNORMAL HIGH (ref 70–99)
Potassium: 3.8 mmol/L (ref 3.5–5.1)
Sodium: 144 mmol/L (ref 135–145)
Total Bilirubin: 0.7 mg/dL (ref 0.3–1.2)
Total Protein: 5.7 g/dL — ABNORMAL LOW (ref 6.5–8.1)

## 2021-08-26 LAB — CBC
HCT: 38.3 % (ref 36.0–46.0)
Hemoglobin: 12.5 g/dL (ref 12.0–15.0)
MCH: 30.6 pg (ref 26.0–34.0)
MCHC: 32.6 g/dL (ref 30.0–36.0)
MCV: 93.9 fL (ref 80.0–100.0)
Platelets: 233 10*3/uL (ref 150–400)
RBC: 4.08 MIL/uL (ref 3.87–5.11)
RDW: 14.1 % (ref 11.5–15.5)
WBC: 7.2 10*3/uL (ref 4.0–10.5)
nRBC: 0 % (ref 0.0–0.2)

## 2021-08-26 NOTE — Progress Notes (Signed)
Progress Note     Subjective: No acute changes. No flatus or bowel movement yet. Denies nausea or vomiting. XR shows persistent small bowel distension, unchanged. No passage of contrast into colon.   Objective: Vital signs in last 24 hours: Temp:  [97.8 F (36.6 C)-99.3 F (37.4 C)] 98.3 F (36.8 C) (08/19 0501) Pulse Rate:  [62-70] 66 (08/19 0501) Resp:  [14-24] 18 (08/19 0501) BP: (134-180)/(62-74) 148/74 (08/19 0501) SpO2:  [94 %-99 %] 94 % (08/19 0501) Last BM Date : 08/24/21  Intake/Output from previous day: 08/18 0701 - 08/19 0700 In: 100 [IV Piggyback:100] Out: 930 [Emesis/NG output:930] Intake/Output this shift: No intake/output data recorded.  PE: General: pleasant, WD, female who is laying in bed in NAD Abd: soft, hypoactive BS. Mild TTP epigastrium without peritonitis. Well healed midline surgical scar. NGT with bilious output MSK: all 4 extremities are symmetrical with no cyanosis, clubbing, or edema. Skin: warm and dry with no masses, lesions, or rashes Psych: A&Ox3 with an appropriate affect.    Lab Results:  Recent Labs    08/25/21 0502 08/26/21 0437  WBC 10.3 7.2  HGB 13.6 12.5  HCT 41.0 38.3  PLT 248 233   BMET Recent Labs    08/25/21 0502 08/26/21 0437  NA 139 144  K 4.5 3.8  CL 104 111  CO2 25 28  GLUCOSE 137* 111*  BUN 13 16  CREATININE 0.92 0.83  CALCIUM 9.2 9.0   PT/INR No results for input(s): "LABPROT", "INR" in the last 72 hours. CMP     Component Value Date/Time   NA 144 08/26/2021 0437   K 3.8 08/26/2021 0437   CL 111 08/26/2021 0437   CO2 28 08/26/2021 0437   GLUCOSE 111 (H) 08/26/2021 0437   BUN 16 08/26/2021 0437   CREATININE 0.83 08/26/2021 0437   CALCIUM 9.0 08/26/2021 0437   PROT 5.7 (L) 08/26/2021 0437   ALBUMIN 3.0 (L) 08/26/2021 0437   AST 22 08/26/2021 0437   ALT 12 08/26/2021 0437   ALKPHOS 44 08/26/2021 0437   BILITOT 0.7 08/26/2021 0437   GFRNONAA >60 08/26/2021 0437   GFRAA >60 10/21/2018 0612    Lipase     Component Value Date/Time   LIPASE 32 08/24/2021 2250       Studies/Results: DG Abd Portable 1V-Small Bowel Obstruction Protocol-initial, 8 hr delay  Result Date: 08/25/2021 CLINICAL DATA:  Small-bowel obstruction EXAM: PORTABLE ABDOMEN - 1 VIEW COMPARISON:  CT done earlier today FINDINGS: There is dilation of small bowel loops measuring up to 7 cm in diameter. Tip of enteric tube is seen in the stomach. Side port in the enteric tube is slightly below the level of gastroesophageal junction. IMPRESSION: There is dilation of small-bowel loops suggesting small-bowel obstruction. Tip of enteric tube is seen in the stomach. Electronically Signed   By: Ernie Avena M.D.   On: 08/25/2021 16:45   DG Abdomen 1 View  Result Date: 08/25/2021 CLINICAL DATA:  Check gastric catheter placement EXAM: ABDOMEN - 1 VIEW COMPARISON:  None Available. FINDINGS: Scattered large and small bowel gas is noted. Small-bowel dilatation is noted consistent with a degree of small-bowel obstruction this is consistent with the prior CT. Contrast material is noted in the bladder and collecting systems consistent with the recent CT. Gastric catheter is noted within the stomach. Degenerative changes of lumbar spine are noted. IMPRESSION: Gastric catheter within the stomach. Persistent small bowel dilatation is noted. Electronically Signed   By: Eulah Pont.D.  On: 08/25/2021 02:32   CT ABDOMEN PELVIS W CONTRAST  Addendum Date: 08/25/2021   ADDENDUM REPORT: 08/25/2021 01:29 ADDENDUM: Critical findings were reported to Dr. Nicanor Alcon at 1:29 a.m. Electronically Signed   By: Thornell Sartorius M.D.   On: 08/25/2021 01:29   Result Date: 08/25/2021 CLINICAL DATA:  Abdominal pain, nausea, and abdominal distension. Bowel obstruction suspected. EXAM: CT ABDOMEN AND PELVIS WITH CONTRAST TECHNIQUE: Multidetector CT imaging of the abdomen and pelvis was performed using the standard protocol following bolus administration  of intravenous contrast. RADIATION DOSE REDUCTION: This exam was performed according to the departmental dose-optimization program which includes automated exposure control, adjustment of the mA and/or kV according to patient size and/or use of iterative reconstruction technique. CONTRAST:  OMNIPAQUE IOHEXOL 300 MG/ML  SOLN COMPARISON:  None Available. FINDINGS: Lower chest: The heart is enlarged. Mild atelectasis is present at the lung bases. Hepatobiliary: There is a cyst in the left lobe of the liver measuring 7 mm. No biliary ductal dilatation. The gallbladder is without stones. Pancreas: Unremarkable. No pancreatic ductal dilatation or surrounding inflammatory changes. Spleen: Normal in size without focal abnormality. Adrenals/Urinary Tract: No adrenal nodule or mass. The kidneys enhance symmetrically. No renal calculus or hydronephrosis. The bladder is unremarkable. Stomach/Bowel: The stomach is distended with fluid. There is a distended loop of small bowel in the mid right abdomen an anastomotic site measuring 7.3 cm in diameter. There are multiple loops of distended small bowel in the abdomen measuring up to 3.5 cm. A transition point is present in the right lower quadrant, best seen on coronal image 34 and axial image 51. No free air or pneumatosis. Scattered diverticula are present along the colon without evidence of diverticulitis. The appendix is not visualized on exam. There is a right lower quadrant ventral abdominal wall hernia containing nonobstructed small bowel. Vascular/Lymphatic: Aortic atherosclerosis. No enlarged abdominal or pelvic lymph nodes. Reproductive: Uterus and bilateral adnexa are unremarkable. Other: Mesenteric edema and a small amount of free fluid is noted in the right lower quadrant. Musculoskeletal: Degenerative changes in the thoracolumbar spine. No acute osseous abnormality. IMPRESSION: 1. Multiple distended loops of small bowel in the abdomen measuring up to 3.5 cm. A  patulous loop of small bowel and anastomotic site measures up to 7.3 cm. A transition point is present in the right lower quadrant, compatible with small-bowel obstruction. Surgical consultation is recommended. 2. Right lower quadrant ventral abdominal wall hernia containing nonobstructed small bowel. 3. Diverticulosis without diverticulitis. 4. Hepatic cyst. 5. Aortic atherosclerosis. Electronically Signed: By: Thornell Sartorius M.D. On: 08/25/2021 01:25    Anti-infectives: Anti-infectives (From admission, onward)    Start     Dose/Rate Route Frequency Ordered Stop   08/26/21 0200  cefTRIAXone (ROCEPHIN) 1 g in sodium chloride 0.9 % 100 mL IVPB        1 g 200 mL/hr over 30 Minutes Intravenous Every 24 hours 08/25/21 0324     08/25/21 0115  cefTRIAXone (ROCEPHIN) 1 g in sodium chloride 0.9 % 100 mL IVPB        1 g 200 mL/hr over 30 Minutes Intravenous  Once 08/25/21 0114 08/25/21 0333        Assessment/Plan  SBO - history of exploratory laparotomy and open appendectomy - No current indication for emergency surgery, however patient has persistent obstructive symptoms. - NGT for decompression and keep NPO. Ok for ice chips. - SBO protocol - Keep K > 4 and Mg > 2 for bowel function - Mobilize for bowel  function - Hopefully patient will improve with conservative management, however I discussed the possibility of surgery with her today. If she does not have return of bowel function in the next 24-48 hours, she will likely need exploratory laparotomy. I discussed this with her today, and at her request I also called her son Chanetta Marshall and provided him with an update and discussed the possibility of surgery via phone.   FEN: NPO, IVF ID: [email or web address] (UTI) VTE: okay for prophylaxis from surgical perspective   LOS: 1 day   Fritzi Mandes, MD [facility] Surgery 08/26/2021, 9:47 AM Please see Amion for pager number during day hours 7:00am-4:30pm
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
## 2021-08-27 ENCOUNTER — Inpatient Hospital Stay (HOSPITAL_COMMUNITY): Payer: Medicare Other

## 2021-08-27 DIAGNOSIS — I159 Secondary hypertension, unspecified: Secondary | ICD-10-CM | POA: Diagnosis not present

## 2021-08-27 DIAGNOSIS — K56609 Unspecified intestinal obstruction, unspecified as to partial versus complete obstruction: Secondary | ICD-10-CM | POA: Diagnosis not present

## 2021-08-27 DIAGNOSIS — N390 Urinary tract infection, site not specified: Secondary | ICD-10-CM | POA: Diagnosis not present

## 2021-08-27 LAB — BASIC METABOLIC PANEL
Anion gap: 8 (ref 5–15)
BUN: 24 mg/dL — ABNORMAL HIGH (ref 8–23)
CO2: 26 mmol/L (ref 22–32)
Calcium: 8.8 mg/dL — ABNORMAL LOW (ref 8.9–10.3)
Chloride: 110 mmol/L (ref 98–111)
Creatinine, Ser: 0.66 mg/dL (ref 0.44–1.00)
GFR, Estimated: >60 mL/min (ref >60)
Glucose, Bld: 104 mg/dL — ABNORMAL HIGH (ref 70–99)
Potassium: 3.5 mmol/L (ref 3.5–5.1)
Sodium: 144 mmol/L (ref 135–145)

## 2021-08-27 LAB — URINE CULTURE: Culture: >=100000 — AB

## 2021-08-27 LAB — GLUCOSE, CAPILLARY: Glucose-Capillary: 137 mg/dL — ABNORMAL HIGH (ref 70–99)

## 2021-08-27 MED ORDER — CEFAZOLIN SODIUM-DEXTROSE 1-4 GM/50ML-% IV SOLN
1.0000 g | Freq: Three times a day (TID) | INTRAVENOUS | Status: DC
  Administered 2021-08-27 – 2021-08-28 (×2): 1 g via INTRAVENOUS
  Filled 2021-08-27 (×4): qty 50

## 2021-08-27 MED ORDER — KCL IN DEXTROSE-NACL 20-5-0.45 MEQ/L-%-% IV SOLN
INTRAVENOUS | Status: DC
  Filled 2021-08-27 (×4): qty 1000
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
## 2021-08-27 NOTE — Progress Notes (Signed)
   Subjective/Chief Complaint: No complaints. No flatus yet   Objective: Vital signs in last 24 hours: Temp:  [98 F (36.7 C)-98.5 F (36.9 C)] 98.4 F (36.9 C) (08/20 0553) Pulse Rate:  [62-65] 62 (08/20 0553) Resp:  [16-20] 16 (08/20 0553) BP: (146-172)/(66-72) 159/71 (08/20 0553) SpO2:  [95 %-97 %] 95 % (08/20 0553) Last BM Date : 08/24/21  Intake/Output from previous day: 08/19 0701 - 08/20 0700 In: 120.4 [P.O.:120; IV Piggyback:0.4] Out: 950 [Urine:400; Emesis/NG output:550] Intake/Output this shift: No intake/output data recorded.  General appearance: alert and cooperative Resp: clear to auscultation bilaterally Cardio: regular rate and rhythm GI: very soft, mild distension. Few bs  Lab Results:  Recent Labs    08/25/21 0502 08/26/21 0437  WBC 10.3 7.2  HGB 13.6 12.5  HCT 41.0 38.3  PLT 248 233   BMET Recent Labs    08/26/21 0437 08/27/21 0426  NA 144 144  K 3.8 3.5  CL 111 110  CO2 28 26  GLUCOSE 111* 104*  BUN 16 24*  CREATININE 0.83 0.66  CALCIUM 9.0 8.8*   PT/INR No results for input(s): "LABPROT", "INR" in the last 72 hours. ABG No results for input(s): "PHART", "HCO3" in the last 72 hours.  Invalid input(s): "PCO2", "PO2"  Studies/Results: DG Abd Portable 1V-Small Bowel Obstruction Protocol-initial, 8 hr delay  Result Date: 08/25/2021 CLINICAL DATA:  Small-bowel obstruction EXAM: PORTABLE ABDOMEN - 1 VIEW COMPARISON:  CT done earlier today FINDINGS: There is dilation of small bowel loops measuring up to 7 cm in diameter. Tip of enteric tube is seen in the stomach. Side port in the enteric tube is slightly below the level of gastroesophageal junction. IMPRESSION: There is dilation of small-bowel loops suggesting small-bowel obstruction. Tip of enteric tube is seen in the stomach. Electronically Signed   By: Ernie Avena M.D.   On: 08/25/2021 16:45    Anti-infectives: Anti-infectives (From admission, onward)    Start     Dose/Rate  Route Frequency Ordered Stop   08/26/21 0200  cefTRIAXone (ROCEPHIN) 1 g in sodium chloride 0.9 % 100 mL IVPB        1 g 200 mL/hr over 30 Minutes Intravenous Every 24 hours 08/25/21 0324     08/25/21 0115  cefTRIAXone (ROCEPHIN) 1 g in sodium chloride 0.9 % 100 mL IVPB        1 g 200 mL/hr over 30 Minutes Intravenous  Once 08/25/21 0114 08/25/21 0333       Assessment/Plan: s/p * No surgery found * Continue ng and bowel rest for now Abd very soft with complicated surgical history. Will continue another day of decompression. Abd xays unchanged. If she doesn't open up soon then she may need ex lap  LOS: 2 days    Chevis Pretty III 08/27/2021

## 2021-08-27 NOTE — Progress Notes (Signed)
Pt refused mobility at this time. Pt complains of weakness and nausea.  Will monitor pt.

## 2021-08-28 ENCOUNTER — Other Ambulatory Visit: Payer: Self-pay

## 2021-08-28 ENCOUNTER — Inpatient Hospital Stay (HOSPITAL_COMMUNITY): Payer: Medicare Other | Admitting: Anesthesiology

## 2021-08-28 ENCOUNTER — Encounter (HOSPITAL_COMMUNITY): Payer: Self-pay | Admitting: Internal Medicine

## 2021-08-28 ENCOUNTER — Encounter (HOSPITAL_COMMUNITY)
Admission: EM | Disposition: A | Discharge status: Skilled Nursing Facility | Payer: Self-pay | Source: Home / Self Care | Attending: Internal Medicine

## 2021-08-28 DIAGNOSIS — I159 Secondary hypertension, unspecified: Secondary | ICD-10-CM | POA: Diagnosis not present

## 2021-08-28 DIAGNOSIS — N390 Urinary tract infection, site not specified: Secondary | ICD-10-CM | POA: Diagnosis not present

## 2021-08-28 DIAGNOSIS — K56609 Unspecified intestinal obstruction, unspecified as to partial versus complete obstruction: Secondary | ICD-10-CM | POA: Diagnosis not present

## 2021-08-28 DIAGNOSIS — F418 Other specified anxiety disorders: Secondary | ICD-10-CM

## 2021-08-28 DIAGNOSIS — K432 Incisional hernia without obstruction or gangrene: Secondary | ICD-10-CM

## 2021-08-28 DIAGNOSIS — K565 Intestinal adhesions [bands], unspecified as to partial versus complete obstruction: Secondary | ICD-10-CM

## 2021-08-28 DIAGNOSIS — I10 Essential (primary) hypertension: Secondary | ICD-10-CM

## 2021-08-28 HISTORY — PX: LAPAROSCOPY: SHX197

## 2021-08-28 LAB — SURGICAL PCR SCREEN
MRSA, PCR: NEGATIVE
Staphylococcus aureus: NEGATIVE

## 2021-08-28 LAB — TYPE AND SCREEN
ABO/RH(D): O POS
Antibody Screen: NEGATIVE

## 2021-08-28 SURGERY — LAPAROSCOPY, DIAGNOSTIC
Anesthesia: General

## 2021-08-28 MED ORDER — DEXAMETHASONE SODIUM PHOSPHATE 10 MG/ML IJ SOLN
INTRAMUSCULAR | Status: AC
  Filled 2021-08-28: qty 1

## 2021-08-28 MED ORDER — MAGIC MOUTHWASH
15.0000 mL | Freq: Four times a day (QID) | ORAL | Status: AC
  Administered 2021-08-28 – 2021-08-30 (×7): 15 mL via ORAL
  Filled 2021-08-28 (×14): qty 15

## 2021-08-28 MED ORDER — LACTATED RINGERS IV SOLN: INTRAVENOUS | Status: DC

## 2021-08-28 MED ORDER — PROCHLORPERAZINE EDISYLATE 10 MG/2ML IJ SOLN: 5.0000 mg | INTRAMUSCULAR | Status: DC | PRN

## 2021-08-28 MED ORDER — LIDOCAINE HCL (CARDIAC) PF 100 MG/5ML IV SOSY
PREFILLED_SYRINGE | INTRAVENOUS | Status: DC | PRN
  Administered 2021-08-28: 60 mg via INTRAVENOUS

## 2021-08-28 MED ORDER — SODIUM CHLORIDE 0.9 % IV SOLN: 8.0000 mg | Freq: Four times a day (QID) | INTRAVENOUS | Status: DC | PRN

## 2021-08-28 MED ORDER — LACTATED RINGERS IV BOLUS: 1000.0000 mL | Freq: Three times a day (TID) | INTRAVENOUS | Status: DC | PRN

## 2021-08-28 MED ORDER — PHENYLEPHRINE HCL-NACL 20-0.9 MG/250ML-% IV SOLN
INTRAVENOUS | Status: DC | PRN
  Administered 2021-08-28: 40 ug/min via INTRAVENOUS

## 2021-08-28 MED ORDER — 0.9 % SODIUM CHLORIDE (POUR BTL) OPTIME
TOPICAL | Status: DC | PRN
  Administered 2021-08-28: 4000 mL

## 2021-08-28 MED ORDER — LACTATED RINGERS IV SOLN: INTRAVENOUS | Status: DC | PRN

## 2021-08-28 MED ORDER — BUPIVACAINE-EPINEPHRINE 0.5% -1:200000 IJ SOLN
INTRAMUSCULAR | Status: DC | PRN
  Administered 2021-08-28: 30 mL

## 2021-08-28 MED ORDER — MAGIC MOUTHWASH: 15.0000 mL | Freq: Four times a day (QID) | ORAL | Status: DC | PRN

## 2021-08-28 MED ORDER — LABETALOL HCL 5 MG/ML IV SOLN
INTRAVENOUS | Status: AC
  Filled 2021-08-28: qty 4

## 2021-08-28 MED ORDER — SODIUM CHLORIDE 0.9 % IV SOLN
INTRAVENOUS | Status: DC | PRN
  Administered 2021-08-28: 2 g via INTRAVENOUS

## 2021-08-28 MED ORDER — SUCCINYLCHOLINE CHLORIDE 200 MG/10ML IV SOSY
PREFILLED_SYRINGE | INTRAVENOUS | Status: DC | PRN
  Administered 2021-08-28: 100 mg via INTRAVENOUS

## 2021-08-28 MED ORDER — CHLORHEXIDINE GLUCONATE CLOTH 2 % EX PADS: 6.0000 | MEDICATED_PAD | Freq: Once | CUTANEOUS | Status: DC

## 2021-08-28 MED ORDER — ESMOLOL HCL 100 MG/10ML IV SOLN
INTRAVENOUS | Status: DC | PRN
  Administered 2021-08-28: 30 mg via INTRAVENOUS

## 2021-08-28 MED ORDER — SUGAMMADEX SODIUM 200 MG/2ML IV SOLN
INTRAVENOUS | Status: DC | PRN
  Administered 2021-08-28: 200 mg via INTRAVENOUS

## 2021-08-28 MED ORDER — SODIUM CHLORIDE 0.9 % IV SOLN
2.0000 g | INTRAVENOUS | Status: DC
  Filled 2021-08-28: qty 2

## 2021-08-28 MED ORDER — CHLORHEXIDINE GLUCONATE 0.12 % MT SOLN: 15.0000 mL | Freq: Once | OROMUCOSAL | Status: DC

## 2021-08-28 MED ORDER — SIMETHICONE 40 MG/0.6ML PO SUSP: 80.0000 mg | Freq: Four times a day (QID) | ORAL | Status: DC | PRN

## 2021-08-28 MED ORDER — BUPIVACAINE LIPOSOME 1.3 % IJ SUSP
INTRAMUSCULAR | Status: DC | PRN
  Administered 2021-08-28: 20 mL

## 2021-08-28 MED ORDER — ALUM & MAG HYDROXIDE-SIMETH 200-200-20 MG/5ML PO SUSP
30.0000 mL | Freq: Four times a day (QID) | ORAL | Status: DC | PRN
  Administered 2021-09-05: 30 mL via ORAL
  Filled 2021-08-28: qty 30

## 2021-08-28 MED ORDER — HYDROMORPHONE HCL 1 MG/ML IJ SOLN
0.5000 mg | INTRAMUSCULAR | Status: DC | PRN
  Administered 2021-08-28: 1 mg via INTRAVENOUS
  Administered 2021-08-29 – 2021-09-02 (×6): 0.5 mg via INTRAVENOUS
  Administered 2021-09-02 – 2021-09-03 (×2): 1 mg via INTRAVENOUS
  Filled 2021-08-28 (×10): qty 1

## 2021-08-28 MED ORDER — GLYCOPYRROLATE 0.2 MG/ML IJ SOLN
INTRAMUSCULAR | Status: AC
  Filled 2021-08-28: qty 1

## 2021-08-28 MED ORDER — BISACODYL 10 MG RE SUPP
10.0000 mg | Freq: Every day | RECTAL | Status: DC
  Administered 2021-08-29 – 2021-09-04 (×5): 10 mg via RECTAL
  Filled 2021-08-28 (×7): qty 1

## 2021-08-28 MED ORDER — ROCURONIUM BROMIDE 10 MG/ML (PF) SYRINGE
PREFILLED_SYRINGE | INTRAVENOUS | Status: AC
  Filled 2021-08-28: qty 10

## 2021-08-28 MED ORDER — FENTANYL CITRATE (PF) 250 MCG/5ML IJ SOLN
INTRAMUSCULAR | Status: AC
  Filled 2021-08-28: qty 5

## 2021-08-28 MED ORDER — DIPHENHYDRAMINE HCL 50 MG/ML IJ SOLN
12.5000 mg | Freq: Four times a day (QID) | INTRAMUSCULAR | Status: DC | PRN
  Administered 2021-09-04: 12.5 mg via INTRAVENOUS
  Filled 2021-08-28: qty 1

## 2021-08-28 MED ORDER — METHOCARBAMOL 1000 MG/10ML IJ SOLN: 1000.0000 mg | Freq: Four times a day (QID) | INTRAVENOUS | Status: DC | PRN

## 2021-08-28 MED ORDER — FENTANYL CITRATE (PF) 100 MCG/2ML IJ SOLN
INTRAMUSCULAR | Status: DC | PRN
  Administered 2021-08-28 (×4): 25 ug via INTRAVENOUS
  Administered 2021-08-28: 50 ug via INTRAVENOUS
  Administered 2021-08-28: 25 ug via INTRAVENOUS
  Administered 2021-08-28: 50 ug via INTRAVENOUS
  Administered 2021-08-28: 25 ug via INTRAVENOUS

## 2021-08-28 MED ORDER — BUPIVACAINE-EPINEPHRINE (PF) 0.5% -1:200000 IJ SOLN
INTRAMUSCULAR | Status: AC
  Filled 2021-08-28: qty 30

## 2021-08-28 MED ORDER — ORAL CARE MOUTH RINSE: 15.0000 mL | Freq: Once | OROMUCOSAL | Status: DC

## 2021-08-28 MED ORDER — DEXAMETHASONE SODIUM PHOSPHATE 10 MG/ML IJ SOLN
INTRAMUSCULAR | Status: DC | PRN
  Administered 2021-08-28: 5 mg via INTRAVENOUS

## 2021-08-28 MED ORDER — BUPIVACAINE LIPOSOME 1.3 % IJ SUSP
INTRAMUSCULAR | Status: AC
Start: 2021-08-28 — End: ?
  Filled 2021-08-28: qty 20

## 2021-08-28 MED ORDER — ONDANSETRON HCL 4 MG/2ML IJ SOLN
4.0000 mg | Freq: Four times a day (QID) | INTRAMUSCULAR | Status: DC | PRN
  Administered 2021-08-29: 4 mg via INTRAVENOUS
  Filled 2021-08-28: qty 2

## 2021-08-28 MED ORDER — ONDANSETRON HCL 4 MG/2ML IJ SOLN
INTRAMUSCULAR | Status: DC | PRN
  Administered 2021-08-28: 4 mg via INTRAVENOUS

## 2021-08-28 MED ORDER — ONDANSETRON HCL 4 MG/2ML IJ SOLN
INTRAMUSCULAR | Status: AC
  Filled 2021-08-28: qty 2

## 2021-08-28 MED ORDER — PHENOL 1.4 % MT LIQD: 2.0000 | OROMUCOSAL | Status: DC | PRN

## 2021-08-28 MED ORDER — ROCURONIUM BROMIDE 100 MG/10ML IV SOLN
INTRAVENOUS | Status: DC | PRN
  Administered 2021-08-28: 50 mg via INTRAVENOUS
  Administered 2021-08-28 (×2): 10 mg via INTRAVENOUS

## 2021-08-28 MED ORDER — MENTHOL 3 MG MT LOZG: 1.0000 | LOZENGE | OROMUCOSAL | Status: DC | PRN

## 2021-08-28 MED ORDER — PROPOFOL 10 MG/ML IV BOLUS
INTRAVENOUS | Status: DC | PRN
  Administered 2021-08-28: 80 mg via INTRAVENOUS

## 2021-08-28 MED ORDER — LIP MEDEX EX OINT
TOPICAL_OINTMENT | Freq: Two times a day (BID) | CUTANEOUS | Status: DC
  Administered 2021-08-29 – 2021-08-30 (×3): 1 via TOPICAL
  Administered 2021-09-03: 75 via TOPICAL
  Administered 2021-09-03: 1 via TOPICAL
  Administered 2021-09-04 – 2021-09-05 (×2): 75 via TOPICAL
  Filled 2021-08-28 (×2): qty 7

## 2021-08-28 MED ORDER — LABETALOL HCL 5 MG/ML IV SOLN
10.0000 mg | Freq: Once | INTRAVENOUS | Status: AC
  Administered 2021-08-28: 10 mg via INTRAVENOUS

## 2021-08-28 MED ORDER — PHENYLEPHRINE 80 MCG/ML (10ML) SYRINGE FOR IV PUSH (FOR BLOOD PRESSURE SUPPORT)
PREFILLED_SYRINGE | INTRAVENOUS | Status: AC
  Filled 2021-08-28: qty 10

## 2021-08-28 MED ORDER — LIDOCAINE 2% (20 MG/ML) 5 ML SYRINGE
INTRAMUSCULAR | Status: AC
  Filled 2021-08-28: qty 5

## 2021-08-28 MED ORDER — ESMOLOL HCL 100 MG/10ML IV SOLN
INTRAVENOUS | Status: AC
  Filled 2021-08-28: qty 10

## 2021-08-28 MED ORDER — GLYCOPYRROLATE 0.2 MG/ML IJ SOLN
INTRAMUSCULAR | Status: DC | PRN
  Administered 2021-08-28: .1 mg via INTRAVENOUS

## 2021-08-28 MED ORDER — CEFAZOLIN SODIUM-DEXTROSE 1-4 GM/50ML-% IV SOLN
1.0000 g | Freq: Three times a day (TID) | INTRAVENOUS | Status: AC
  Administered 2021-08-29 – 2021-08-31 (×9): 1 g via INTRAVENOUS
  Filled 2021-08-28 (×10): qty 50

## 2021-08-28 MED ORDER — SUCCINYLCHOLINE CHLORIDE 200 MG/10ML IV SOSY
PREFILLED_SYRINGE | INTRAVENOUS | Status: AC
  Filled 2021-08-28: qty 10

## 2021-08-28 MED ORDER — SODIUM CHLORIDE 0.9 % IV SOLN
2.0000 g | Freq: Two times a day (BID) | INTRAVENOUS | Status: AC
  Administered 2021-08-28: 2 g via INTRAVENOUS
  Filled 2021-08-28: qty 2

## 2021-08-28 SURGICAL SUPPLY — 54 items
BAG COUNTER SPONGE SURGICOUNT (BAG) IMPLANT
CABLE HIGH FREQUENCY MONO STRZ (ELECTRODE) ×1 IMPLANT
COVER SURGICAL LIGHT HANDLE (MISCELLANEOUS) ×1 IMPLANT
DRAPE UTILITY XL STRL (DRAPES) ×1 IMPLANT
DRAPE WARM FLUID 44X44 (DRAPES) ×1 IMPLANT
DRSG OPSITE POSTOP 4X8 (GAUZE/BANDAGES/DRESSINGS) IMPLANT
DRSG TEGADERM 2-3/8X2-3/4 SM (GAUZE/BANDAGES/DRESSINGS) IMPLANT
DRSG TEGADERM 4X4.75 (GAUZE/BANDAGES/DRESSINGS) IMPLANT
ELECT PENCIL ROCKER SW 15FT (MISCELLANEOUS) IMPLANT
ELECT REM PT RETURN 15FT ADLT (MISCELLANEOUS) ×1 IMPLANT
GAUZE SPONGE 2X2 8PLY STRL LF (GAUZE/BANDAGES/DRESSINGS) ×1 IMPLANT
GLOVE ECLIPSE 8.0 STRL XLNG CF (GLOVE) ×1 IMPLANT
GLOVE INDICATOR 8.0 STRL GRN (GLOVE) ×1 IMPLANT
GOWN STRL REUS W/ TWL XL LVL3 (GOWN DISPOSABLE) ×4 IMPLANT
GOWN STRL REUS W/TWL XL LVL3 (GOWN DISPOSABLE) ×4
IRRIG SUCT STRYKERFLOW 2 WTIP (MISCELLANEOUS) ×1
IRRIGATION SUCT STRKRFLW 2 WTP (MISCELLANEOUS) ×1 IMPLANT
KIT BASIN OR (CUSTOM PROCEDURE TRAY) ×1 IMPLANT
KIT TURNOVER KIT A (KITS) IMPLANT
PAD POSITIONING PINK XL (MISCELLANEOUS) ×1 IMPLANT
PROTECTOR NERVE ULNAR (MISCELLANEOUS) IMPLANT
RELOAD PROXIMATE 75MM BLUE (ENDOMECHANICALS) ×1 IMPLANT
RELOAD STAPLE 75 3.8 BLU REG (ENDOMECHANICALS) IMPLANT
RETRACTOR WOUND ALXS 34CM XLRG (MISCELLANEOUS) IMPLANT
RTRCTR WOUND ALEXIS 34CM XLRG (MISCELLANEOUS) ×1
SCISSORS LAP 5X35 DISP (ENDOMECHANICALS) ×1 IMPLANT
SEALER TISSUE G2 STRG ARTC 35C (ENDOMECHANICALS) IMPLANT
SET TUBE SMOKE EVAC HIGH FLOW (TUBING) ×1 IMPLANT
SLEEVE ADV FIXATION 5X100MM (TROCAR) IMPLANT
SLEEVE Z-THREAD 5X100MM (TROCAR) ×2 IMPLANT
SPIKE FLUID TRANSFER (MISCELLANEOUS) ×1 IMPLANT
SPONGE GAUZE 2X2 STER 10/PKG (GAUZE/BANDAGES/DRESSINGS) ×1
STAPLER 90 3.5 STAND SLIM (STAPLE) ×1
STAPLER 90 3.5 STD SLIM (STAPLE) IMPLANT
STAPLER PROXIMATE 75MM BLUE (STAPLE) IMPLANT
SUT MNCRL AB 4-0 PS2 18 (SUTURE) ×1 IMPLANT
SUT PDS AB 1 TP1 96 (SUTURE) IMPLANT
SUT SILK 0 (SUTURE) ×2
SUT SILK 0 30XBRD TIE 6 (SUTURE) IMPLANT
SUT SILK 2 0 (SUTURE) ×1
SUT SILK 2 0 SH CR/8 (SUTURE) ×1 IMPLANT
SUT SILK 2-0 18XBRD TIE 12 (SUTURE) ×1 IMPLANT
SUT SILK 3 0 (SUTURE) ×1
SUT SILK 3 0 SH CR/8 (SUTURE) ×1 IMPLANT
SUT SILK 3-0 18XBRD TIE 12 (SUTURE) ×1 IMPLANT
TOWEL OR 17X26 10 PK STRL BLUE (TOWEL DISPOSABLE) ×1 IMPLANT
TOWEL OR NON WOVEN STRL DISP B (DISPOSABLE) ×1 IMPLANT
TRAY FOL W/BAG SLVR 16FR STRL (SET/KITS/TRAYS/PACK) IMPLANT
TRAY FOLEY MTR SLVR 16FR STAT (SET/KITS/TRAYS/PACK) IMPLANT
TRAY FOLEY W/BAG SLVR 16FR LF (SET/KITS/TRAYS/PACK) ×1
TRAY LAPAROSCOPIC (CUSTOM PROCEDURE TRAY) ×1 IMPLANT
TROCAR 11X100 Z THREAD (TROCAR) IMPLANT
TROCAR ADV FIXATION 5X100MM (TROCAR) IMPLANT
TROCAR Z-THREAD OPTICAL 5X100M (TROCAR) ×1 IMPLANT

## 2021-08-28 NOTE — Anesthesia Preprocedure Evaluation (Addendum)
Anesthesia Evaluation  Patient identified by MRN, date of birth, ID band Patient awake    Reviewed: Allergy & Precautions, NPO status , Patient's Chart, lab work & pertinent test results  Airway Mallampati: II  TM Distance: >3 FB Neck ROM: Full    Dental  (+) Teeth Intact, Dental Advisory Given, Caps   Pulmonary neg pulmonary ROS,    Pulmonary exam normal breath sounds clear to auscultation       Cardiovascular hypertension, Pt. on medications Normal cardiovascular exam Rhythm:Regular Rate:Normal     Neuro/Psych PSYCHIATRIC DISORDERS Anxiety Depression negative neurological ROS     GI/Hepatic Neg liver ROS, bowel obstruction   Endo/Other  negative endocrine ROS  Renal/GU negative Renal ROS     Musculoskeletal negative musculoskeletal ROS (+)   Abdominal   Peds  Hematology negative hematology ROS (+)   Anesthesia Other Findings Day of surgery medications reviewed with the patient.  Reproductive/Obstetrics                            Anesthesia Physical Anesthesia Plan  ASA: 3  Anesthesia Plan: General   Post-op Pain Management: Ofirmev IV (intra-op)*   Induction: Intravenous, Rapid sequence and Cricoid pressure planned  PONV Risk Score and Plan: 4 or greater and Dexamethasone and Ondansetron  Airway Management Planned: Oral ETT  Additional Equipment:   Intra-op Plan:   Post-operative Plan: Possible Post-op intubation/ventilation  Informed Consent: I have reviewed the patients History and Physical, chart, labs and discussed the procedure including the risks, benefits and alternatives for the proposed anesthesia with the patient or authorized representative who has indicated his/her understanding and acceptance.   Patient has DNR.  Discussed DNR with patient and Continue DNR.   Dental advisory given  Plan Discussed with: CRNA  Anesthesia Plan Comments:         Anesthesia Quick Evaluation
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
## 2021-08-28 NOTE — Op Note (Signed)
PATIENT:  Rebecca Hurley  81 y.o. female  Patient Care Team: Malka So., MD as PCP - General (Internal Medicine) Coralee Rud, PA-C (Cardiology)  PRE-OPERATIVE DIAGNOSIS:  bowel obstruction  POST-OPERATIVE DIAGNOSIS:   SMALL BOWEL OBSTRUCTION WITH NECROSIS INCISIONAL HERNIA  PROCEDURE:   LAPAROSCOPIC LYSIS OF ADHESIONS SMALL BOWEL RESECTION PRIMARY INCISIONAL HERNIA REPAIR  SURGEON:  Ardeth Sportsman, MD  ASSIST: Bailey Mech, PA-C  ANESTHESIA:   local and general  EBL:  Total I/O In: 1100 [I.V.:1000; IV Piggyback:100] Out: 470 [Urine:310; Emesis/NG output:100; Blood:60]  Delay start of Pharmacological VTE agent (>24hrs) due to surgical blood loss or risk of bleeding:  no  DRAINS: none   SPECIMEN:  JEJUNUM   DISPOSITION OF SPECIMEN:  PATHOLOGY  COUNTS:  YES  PLAN OF CARE: Admit to inpatient   PATIENT DISPOSITION:  PACU - hemodynamically stable.   INDICATIONS: Patient with concerning symptoms & work up suspicious for small bowel obstruction.  Patient done.  Nasogastric tube decompression and rehydration.  Small bowel protocol.  Persistent obstruction greater than 72 hours.  Surgery was recommended:  The anatomy & physiology of the digestive tract was discussed.  The pathophysiology of intestinal obstruction was discussed.  Natural history risks without surgery was discussed.   I feel the patient has failed non-operative therapies.  The risks of no intervention will lead to serious problems such as necrosis, perforation, dehydration, etc. that outweigh the operative risks; therefore, I recommended abdominal exploration to diagnose & treat the source of the problem.  Minimally Invasive & open techniques were discussed.   I expressed a good likelihood that surgery will treat the problem.  Risks such as bleeding, infection, abscess, leak, reoperation, bowel resection, possible ostomy, hernia, injury to other organs, need for repair of tissues / organs, need for  further treatment, heart attack, death, and other risks were discussed.   I noted a good likelihood this will help address the problem.  Goals of post-operative recovery were discussed as well.  We will work to minimize complications. Questions were answered.  The patient expresses understanding & wishes to proceed with surgery.  OR FINDINGS: Transition point due to adhesive band between loops of jejunum distal to prior small bowel anastomosis.  Major inflammatory adhesive band source of transition point causing ischemia & a band of necrosis  Therefore small bowel resection done.  Low midline Swiss cheese type incisional hernia in the setting of diastases recti.  Small bowel resection done through hernia.  Incisional hernia primarily repaired with #1 PDS.  CASE DATA:  Type of patient?: LDOW CASE (Surgical Hospitalist [facility] Inpatient)  Status of Case? URGENT Add On  Infection Present At Time Of Surgery (PATOS)?  NO  DESCRIPTION:   Informed consent was confirmed.  The patient underwent general anaesthesia without difficulty.  The patient was positioned appropriately.  VTE prevention in place.  The patient's abdomen was clipped, prepped, & draped in a sterile fashion.  Surgical timeout confirmed our plan.  Peritoneal entry with a laparoscopic port was obtained using Varess spring needle entry technique in the left upper abdomen as the patient was positioned in reverse Trendelenburg.  I induced carbon dioxide insufflation.  No change in end tidal CO2 measurements.  Full symmetrical abdominal distention.  Initial port was carefully placed using optical entry in the left upper quadrant..  Camera inspection revealed no injury.  Extra ports were carefully placed under direct laparoscopic visualization.  Upon entering the abdomen (organ space), I encountered moderate omental adhesions.  I used sharp and focused blunt eye sutures to get the omentum off.  There was an adhesive band causing small bowel to come  up to the low midline peritoneum as well.  Small bowel was somewhat twisted here but it was not a definite transition zone.  Could see Swiss cheese type incisional hernias in the suprapubic region along with diastases recti.  There is no incarcerated bowel within this.  I can clearly see very dilated proximal small bowel and decompressed distal bowel.  Came to a very dilated anastomosis.  It was somewhat twisted upon itself.  Tried to untwist it here but it was apparent there were adhesive bands.  Identify the ileocecal valve.  Found the decompressed terminal ileum and ran the bowel proximally.  Came to anastomosis and there were interloop adhesions with dense thickened band.  Carefully lifted that up and freed off some interloop adhesion bands of serosa to serosa of jejunum.  The worst band was just distal to the anastomosis causing that anastomosis to be massively dilated and the bowel proximal to it as well.  Carefully freed that off.  There was another loop of small bowel densely folded proximal to the anastomosis.  Was no definite transition point but concerning.  Took care to free off interloop adhesions and set for this last band.  Ran the small bowel more proximally and confirmed there were no other abnormalities proximal to the anastomosis.  Upon meticulous inspection of the anastomosis I was trying to see if it would open up and dilate but clearly there was a necrotic band of small bowel just distal to anastomosis where the bowel had been severely kinked.  Mesenteric looked ischemic as well.  I did not feel that was salvageable.  Therefore we decided to do small bowel resection through a low midline incision.  Came through the prior incisional hernia and placed a wound protector.  Eviscerated the small bowel and confirmed that indeed there was necrosis in a band type pattern of the jejunum just distal to the anastomosis.  Resected the old anastomosis in this region.  We used a 75 GIA stapler to do an  antimesenteric side-to-side stapled anastomosis.  Placed a pull tip sucker into the dilated small bowel proximally and milked that down to better decompress it.  Confirmed that the mucosa was viable.  There was some fecalization in the region but otherwise healthier.  We placed a 2-0 silk suture on the proximal end of the noticed phimosis for an antitension stitch, the classic crotch stitch.  I then closed the common staple defect and transected the small bowel using a TX 90.  Came across that.  I took the intervening mesentery using clamps and 2-0 silk ties.  I closed the mesentery transversely to completely cover the TX 90 staple line to good result.  We then reran the small bowel from the ileocecal valve to the ligament of Treitz and confirmed the mesentery laid well without any mesenteric defects or twisting or torsion.  Bowel looked healthier.  We did copious irrigation of several liters to good healthy result.  Found some greater omentum and brought that down.  Closed a great omental defect with some silk sutures.  We had anesthesia advance the nasogastric tube so the tip was more down the antrum to good result.  Stomach well decompressed.  We removed all ports and wound protector and changed gloves.  We used sterile clean unused instruments from this point.  Freed the subcutaneous tissues to better  identify the fascia and trim some hernia sac down to health health the fascia.  We reapproximated the fascia to close the chronic incisional hernia #1 PDS in a running fashion.  Issues are better and came together without tension.  Freed off hernia skin and skin and closed using 4 Monocryl suture.  Port sites closed with 4 Monocryl suture.  Sterile dressing applied.  Patient was extubated and in recovery room in stable condition.  I discussed operative findings, updated the patient's status, discussed probable steps to recovery, and gave postoperative recommendations to the patient's grandson, Lance Bosch .   Recommendations were made.  Questions were answered.  He expressed understanding & appreciation.  Adin Hector, M.D., F.A.C.S. Gastrointestinal and Minimally Invasive Surgery [facility] [address] [place], [place] [address] [phone] Main / Paging  08/28/2021 3:58 PM

## 2021-08-28 NOTE — Anesthesia Procedure Notes (Signed)
Procedure Name: Intubation Date/Time: 08/28/2021 3:36 AM  Performed by: Garrel Ridgel, CRNAPre-anesthesia Checklist: Patient identified, Emergency Drugs available, Suction available and Patient being monitored Patient Re-evaluated:Patient Re-evaluated prior to induction Oxygen Delivery Method: Circle system utilized Preoxygenation: Pre-oxygenation with 100% oxygen Induction Type: IV induction Ventilation: Mask ventilation without difficulty Laryngoscope Size: Mac and 3 Grade View: Grade II Tube type: Oral Tube size: 7.0 mm Number of attempts: 1 Airway Equipment and Method: Stylet Placement Confirmation: ETT inserted through vocal cords under direct vision, positive ETCO2 and breath sounds checked- equal and bilateral Secured at: 22 cm Tube secured with: Tape Dental Injury: Teeth and Oropharynx as per pre-operative assessment

## 2021-08-28 NOTE — Progress Notes (Signed)
1130 Unable to do the Peridex rinse per Mrs. Rebecca Hurley.

## 2021-08-28 NOTE — Anesthesia Postprocedure Evaluation (Signed)
Anesthesia Post Note  Patient: Rebecca Hurley  Procedure(s) Performed: EXPLORATORY LAPAROTOMY, LYSIS OF ADHESIONS, SMALL BOWEL RESECTION, PRIMARY INCISIONAL HERNIA REPAIR     Patient location during evaluation: PACU Anesthesia Type: General Level of consciousness: awake and alert Pain management: pain level controlled Vital Signs Assessment: post-procedure vital signs reviewed and stable Respiratory status: spontaneous breathing, nonlabored ventilation, respiratory function stable and patient connected to nasal cannula oxygen Cardiovascular status: blood pressure returned to baseline and stable Postop Assessment: no apparent nausea or vomiting Anesthetic complications: no   No notable events documented.  Last Vitals:  Vitals:   08/28/21 1828 08/28/21 2001  BP: (!) 156/74 (!) 189/82  Pulse: 74 69  Resp: 16 16  Temp: 36.6 C 37.1 C  SpO2: 91% 100%    Last Pain:  Vitals:   08/28/21 2001  TempSrc: Oral  PainSc:                  Collene Schlichter

## 2021-08-28 NOTE — Progress Notes (Signed)
1150 Called for orders, spoke with Gigi Gin, CRNA told  to pull Ancef 2 gm  order in;and the PA will come over and will give orders at that time. 1200 Called back to make aware that the patient was receiving Ancef 1 gm q 8 hours  received it at 0600 and it will be due at 1400, did she still want me to hang the 2 gm Ancef.  Told the PA would give orders when they come to see the patient.
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
## 2021-08-28 NOTE — Progress Notes (Signed)
Transition of Care (TOC) Screening Note  Patient Details  Name: Rebecca Hurley Date of Birth: March 28, 1940  Transition of Care [facility]) CM/SW Contact:    Ewing Schlein, LCSW Phone Number: 08/28/2021, 11:43 AM  Transition of Care Department [facility]) has reviewed patient and no TOC needs have been identified at this time. We will continue to monitor patient advancement through interdisciplinary progression rounds. If new patient transition needs arise, please place a TOC consult.

## 2021-08-28 NOTE — H&P (View-Only) (Signed)
Shawnette Augello Zapien [id number] 1940-07-27  CARE TEAM:  PCP: Malka So., MD  Outpatient Care Team: Patient Care Team: Malka So., MD as PCP - General (Internal Medicine) Elise Benne Ellery Plunk (Cardiology)  Inpatient Treatment Team: Treatment Team: Attending Provider: Meredeth Ide, MD; Consulting Physician: Montez Morita, Md, MD; Rounding Team: Shanda Howells, MD; Pharmacist: Herby Abraham, [facility]; Utilization Review: Clifton James, RN; Registered Nurse: Sanda Klein, RN; Social Worker: Holley Raring; Mobility Specialist: Lorina Rabon   Problem List:   Principal Problem:   SBO (small bowel obstruction) (HCC) Active Problems:   HTN (hypertension)   UTI (urinary tract infection)   Depression       Assessment  Persistent small bowel obstruction despite small bowel protocol  [facility] Stay = 3 days)  Plan:  -Is been over 72 hours since admission with small bowel protocol nasogastric tube decompression and rehydration.  I think she is exhausted nonoperative management.  Standard of care is to offer operative exploration.  Reasonable start out with laparoscopic lysed adhesions and but low threshold to convert to open.  Hopefully can do most of the work laparoscopically and then just put a wound protector through the hernia and go from there.  We will see.  She does have a low midline incisional hernia but it appears small and reducible with decompressed bowel.  Might be wise to try and avoid operating on the region.  Looking at scans transition zone appears to be in the low midline retroperitoneum.  Not at the anastomosis but we will do our due diligence.  Did caution that she is at risk for needing a small bowel resection if there is a obvious stricture there.  We will see.  Her operative risks are increased given her advanced age but she has decent performance status, walking the hallway.  Discussed with Dr. Sharl Ma who is the hospitalist assuming care for her this week and  he feels that she is a reasonable risk for surgery and agrees that she has exhausted nonoperative management.  Patient is open to do it.  She was hard of hearing so I took extra time to talk to her.  Much of her family is out of town but she has a grandson within 1/2-hour he wishes to come up here and be available.  The anatomy & physiology of the digestive tract was discussed.  The pathophysiology of intestinal obstruction was discussed.  Natural history risks without surgery was discussed.   I feel the patient has failed non-operative therapies.  The risks of no intervention will lead to serious problems such as necrosis, perforation, dehydration, etc. that outweigh the operative risks; therefore, I recommended abdominal exploration to diagnose & treat the source of the problem.  Minimally Invasive & open techniques were discussed.   I expressed a good likelihood that surgery will treat the problem.  Risks such as bleeding, infection, abscess, leak, reoperation, bowel resection, possible ostomy, hernia, injury to other organs, need for repair of tissues / organs, need for further treatment, heart attack, death, and other risks were discussed.   I noted a good likelihood this will help address the problem.  Goals of post-operative recovery were discussed as well.  We will work to minimize complications. Questions were answered.  The patient expresses understanding & wishes to proceed with surgery.   -VTE prophylaxis- SCDs, etc -mobilize as tolerated to help recovery  Disposition: TBD       I reviewed nursing notes, ED  provider notes, hospitalist notes, last 24 h vitals and pain scores, last 48 h intake and output, last 24 h labs and trends, and last 24 h imaging results. I have reviewed this patient's available data, including medical history, events of note, test results, etc as part of my evaluation.  A significant portion of that time was spent in counseling.  Care during the described time  interval was provided by me.  This care required moderate level of medical decision making.  08/28/2021    Subjective: (Chief complaint)  Hates the nasogastric tube and understands its importance.  Feels a little more distended today.  No abdominal pain.  No flatus or bowel movement.  Walking the hallways with minimal assist.  Dr. Darrick Meigs with [facility] hospitalist internal medicine primary service on the floor.  He had just seen the patient.  Objective:  Vital signs:  Vitals:   08/27/21 2057 08/27/21 2200 08/27/21 2329 08/28/21 0505  BP: (!) 190/75 (!) 170/75 (!) 147/67 (!) 156/70  Pulse: 62 69 (!) 59 64  Resp: 18 18 17 18   Temp: 97.6 F (36.4 C) 97.8 F (36.6 C) 98.4 F (36.9 C) 98.3 F (36.8 C)  TempSrc: Oral Oral Oral Oral  SpO2: 97% 96% 92% 96%  Weight:      Height:        Last BM Date : 08/24/21  Intake/Output   Yesterday:  08/20 0701 - 08/21 0700 In: 1910.9 [I.V.:1830.9; NG/GT:30; IV Piggyback:50] Out: 850 [Urine:100; Emesis/NG output:750] This shift:  No intake/output data recorded.  Bowel function:  Flatus: No  BM:  No  Drain: Nasogastric tube in place with thick oil can/molasses consistency effluent.   Physical Exam:  General: Pt awake/alert in no acute distress Eyes: PERRL, normal EOM.  Sclera clear.  No icterus Neuro: CN II-XII intact w/o focal sensory/motor deficits. Lymph: No head/neck/groin lymphadenopathy Psych:  No delerium/psychosis/paranoia.  Oriented x 4 HENT: Normocephalic, Mucus membranes moist.  No thrush Neck: Supple, No tracheal deviation.  No obvious thyromegaly Chest: No pain to chest wall compression.  Good respiratory excursion.  No audible wheezing CV:  Pulses intact.  Regular rhythm.  No major extremity edema MS: Normal AROM mjr joints.  No obvious deformity  Abdomen: Soft.  Moderately distended.  Nontender.  No evidence of peritonitis.  Can palpate a lower midline hernia going to the right side but is soft and reducible.     Ext:   No deformity.  No mjr edema.  No cyanosis Skin: No petechiae / purpurea.  No major sores.  Warm and dry    Results:   Cultures: Recent Results (from the past 720 hour(s))  Urine Culture     Status: Abnormal   Collection Time: 08/25/21 12:13 AM   Specimen: Urine, Clean Catch  Result Value Ref Range Status   Specimen Description   Final    URINE, CLEAN CATCH Performed at [facility], [place] [address] [place], [place] [address]    Special Requests   Final    NONE Performed at [facility], [place] [address] [place], [place] [address]    Culture >=100,000 COLONIES/mL ESCHERICHIA COLI (A)  Final   Report Status 08/27/2021 FINAL  Final   Organism ID, Bacteria ESCHERICHIA COLI (A)  Final      Susceptibility   Escherichia coli - MIC*    AMPICILLIN >=32 RESISTANT Resistant     CEFAZOLIN <=4 SENSITIVE Sensitive     CEFEPIME <=0.12 SENSITIVE Sensitive  CEFTRIAXONE <=0.25 SENSITIVE Sensitive     CIPROFLOXACIN <=0.25 SENSITIVE Sensitive     GENTAMICIN >=16 RESISTANT Resistant     IMIPENEM <=0.25 SENSITIVE Sensitive     NITROFURANTOIN <=16 SENSITIVE Sensitive     TRIMETH/SULFA >=320 RESISTANT Resistant     AMPICILLIN/SULBACTAM 16 INTERMEDIATE Intermediate     PIP/TAZO <=4 SENSITIVE Sensitive     * >=100,000 COLONIES/mL ESCHERICHIA COLI  Surgical PCR screen     Status: None   Collection Time: 08/28/21  5:26 AM   Specimen: Nasal Mucosa; Nasal Swab  Result Value Ref Range Status   MRSA, PCR NEGATIVE NEGATIVE Final   Staphylococcus aureus NEGATIVE NEGATIVE Final    Comment: (NOTE) The Xpert SA Assay (FDA approved for NASAL specimens in patients 4 years of age and older), is one component of a comprehensive surveillance program. It is not intended to diagnose infection nor to guide or monitor treatment. Performed at [facility], [place] [address], [place], [place] [address]     Labs: Results for orders placed or  performed during the hospital encounter of 08/24/21 (from the past 48 hour(s))  Basic metabolic panel     Status: Abnormal   Collection Time: 08/27/21  4:26 AM  Result Value Ref Range   Sodium 144 135 - 145 mmol/L   Potassium 3.5 3.5 - 5.1 mmol/L   Chloride 110 98 - 111 mmol/L   CO2 26 22 - 32 mmol/L   Glucose, Bld 104 (H) 70 - 99 mg/dL    Comment: Glucose reference range applies only to samples taken after fasting for at least 8 hours.   BUN 24 (H) 8 - 23 mg/dL   Creatinine, Ser 0.66 0.44 - 1.00 mg/dL   Calcium 8.8 (L) 8.9 - 10.3 mg/dL   GFR, Estimated >60 >60 mL/min    Comment: (NOTE) Calculated using the CKD-EPI Creatinine Equation (2021)    Anion gap 8 5 - 15    Comment: Performed at [facility], [place] [address], [place], [place] [address]  Glucose, capillary     Status: Abnormal   Collection Time: 08/27/21  8:51 PM  Result Value Ref Range   Glucose-Capillary 137 (H) 70 - 99 mg/dL    Comment: Glucose reference range applies only to samples taken after fasting for at least 8 hours.  Surgical PCR screen     Status: None   Collection Time: 08/28/21  5:26 AM   Specimen: Nasal Mucosa; Nasal Swab  Result Value Ref Range   MRSA, PCR NEGATIVE NEGATIVE   Staphylococcus aureus NEGATIVE NEGATIVE    Comment: (NOTE) The Xpert SA Assay (FDA approved for NASAL specimens in patients 20 years of age and older), is one component of a comprehensive surveillance program. It is not intended to diagnose infection nor to guide or monitor treatment. Performed at [facility], [place] [address], [place], [place] [address]     Imaging / Studies: DG Abd 1 View  Result Date: 08/27/2021 CLINICAL DATA:  Small bowel obstruction EXAM: ABDOMEN - 1 VIEW COMPARISON:  08/25/2021 FINDINGS: Multiple dilated loops of bowel measuring up to 7 cm consistent with persistent small-bowel obstruction. No evidence of pneumoperitoneum, portal venous gas or pneumatosis. Nasogastric  tube with the tip projecting over the stomach. No pathologic calcifications along the expected course of the ureters. No acute osseous abnormality. IMPRESSION: 1. Persistent small-bowel obstruction. Electronically Signed   By: Kathreen Devoid M.D.   On: 08/27/2021 09:16    Medications / Allergies:  per chart  Antibiotics: Anti-infectives (From admission, onward)    Start     Dose/Rate Route Frequency Ordered Stop   08/27/21 2200  ceFAZolin (ANCEF) IVPB 1 g/50 mL premix        1 g 100 mL/hr over 30 Minutes Intravenous Every 8 hours 08/27/21 1331     08/26/21 0200  cefTRIAXone (ROCEPHIN) 1 g in sodium chloride 0.9 % 100 mL IVPB  Status:  Discontinued        1 g 200 mL/hr over 30 Minutes Intravenous Every 24 hours 08/25/21 0324 08/27/21 1331   08/25/21 0115  cefTRIAXone (ROCEPHIN) 1 g in sodium chloride 0.9 % 100 mL IVPB        1 g 200 mL/hr over 30 Minutes Intravenous  Once 08/25/21 0114 08/25/21 4315         Note: Portions of this report may have been transcribed using voice recognition software. Every effort was made to ensure accuracy; however, inadvertent computerized transcription errors may be present.   Any transcriptional errors that result from this process are unintentional.    Ardeth Sportsman, MD, FACS, MASCRS Esophageal, Gastrointestinal & Colorectal Surgery Robotic and Minimally Invasive Surgery  [facility] [address] [place], [place] [address] [phone] Fax [phone] Main  CONTACT INFORMATION:  Weekday (9AM-5PM): Call [facility] main office at [phone]  Weeknight (5PM-9AM) or Weekend/Holiday: Check [email or web address] (password " [email or web address]") for General Surgery [facility] coverage  (Please, do not use SecureChat as it is not reliable communication to reach operating surgeons for immediate patient care)      08/28/2021  8:57 AM

## 2021-08-28 NOTE — Discharge Instructions (Signed)
SURGERY: POST OP INSTRUCTIONS (Surgery for small bowel obstruction, colon resection, etc)   ######################################################################  EAT Gradually transition to a high fiber diet with a fiber supplement over the next few days after discharge  WALK Walk an hour a day.  Control your pain to do that.    CONTROL PAIN Control pain so that you can walk, sleep, tolerate sneezing/coughing, go up/down stairs.  HAVE A BOWEL MOVEMENT DAILY Keep your bowels regular to avoid problems.  OK to try a laxative to override constipation.  OK to use an antidairrheal to slow down diarrhea.  Call if not better after 2 tries  CALL IF YOU HAVE PROBLEMS/CONCERNS Call if you are still struggling despite following these instructions. Call if you have concerns not answered by these instructions  ######################################################################   DIET Follow a light diet the first few days at home.  Start with a bland diet such as soups, liquids, starchy foods, low fat foods, etc.  If you feel full, bloated, or constipated, stay on a ful liquid or pureed/blenderized diet for a few days until you feel better and no longer constipated. Be sure to drink plenty of fluids every day to avoid getting dehydrated (feeling dizzy, not urinating, etc.). Gradually add a fiber supplement to your diet over the next week.  Gradually get back to a regular solid diet.  Avoid fast food or heavy meals the first week as you are more likely to get nauseated. It is expected for your digestive tract to need a few months to get back to normal.  It is common for your bowel movements and stools to be irregular.  You will have occasional bloating and cramping that should eventually fade away.  Until you are eating solid food normally, off all pain medications, and back to regular activities; your bowels will not be normal. Focus on eating a low-fat, high fiber diet the rest of your life  (See Getting to Good Bowel Health, below).  CARE of your INCISION or WOUND  It is good for closed incisions and even open wounds to be washed every day.  Shower every day.  Short baths are fine.  Wash the incisions and wounds clean with soap & water.    You may leave closed incisions open to air if it is dry.   You may cover the incision with clean gauze & replace it after your daily shower for comfort.  TEGADERM:  You have clear gauze band-aid dressings over your closed incision(s).  Remove the dressings 3 days after surgery.    If you have an open wound with a wound vac, see wound vac care instructions.    ACTIVITIES as tolerated Start light daily activities --- self-care, walking, climbing stairs-- beginning the day after surgery.  Gradually increase activities as tolerated.  Control your pain to be active.  Stop when you are tired.  Ideally, walk several times a day, eventually an hour a day.   Most people are back to most day-to-day activities in a few weeks.  It takes 4-8 weeks to get back to unrestricted, intense activity. If you can walk 30 minutes without difficulty, it is safe to try more intense activity such as jogging, treadmill, bicycling, low-impact aerobics, swimming, etc. Save the most intensive and strenuous activity for last (Usually 4-8 weeks after surgery) such as sit-ups, heavy lifting, contact sports, etc.  Refrain from any intense heavy lifting or straining until you are off narcotics for pain control.  You will have off days, but   things should improve week-by-week. DO NOT PUSH THROUGH PAIN.  Let pain be your guide: If it hurts to do something, don't do it.  Pain is your body warning you to avoid that activity for another week until the pain goes down. You may drive when you are no longer taking narcotic prescription pain medication, you can comfortably wear a seatbelt, and you can safely make sudden turns/stops to protect yourself without hesitating due to pain. You may  have sexual intercourse when it is comfortable. If it hurts to do something, stop.  MEDICATIONS Take your usually prescribed home medications unless otherwise directed.   Blood thinners:  Usually you can restart any strong blood thinners after the second postoperative day.  It is OK to take aspirin right away.     If you are on strong blood thinners (warfarin/Coumadin, Plavix, Xerelto, Eliquis, Pradaxa, etc), discuss with your surgeon, medicine PCP, and/or cardiologist for instructions on when to restart the blood thinner & if blood monitoring is needed (PT/INR blood check, etc).     PAIN CONTROL Pain after surgery or related to activity is often due to strain/injury to muscle, tendon, nerves and/or incisions.  This pain is usually short-term and will improve in a few months.  To help speed the process of healing and to get back to regular activity more quickly, DO THE FOLLOWING THINGS TOGETHER: Increase activity gradually.  DO NOT PUSH THROUGH PAIN Use Ice and/or Heat Try Gentle Massage and/or Stretching Take over the counter pain medication Take Narcotic prescription pain medication for more severe pain  Good pain control = faster recovery.  It is better to take more medicine to be more active than to stay in bed all day to avoid medications.  Increase activity gradually Avoid heavy lifting at first, then increase to lifting as tolerated over the next 6 weeks. Do not "push through" the pain.  Listen to your body and avoid positions and maneuvers than reproduce the pain.  Wait a few days before trying something more intense Walking an hour a day is encouraged to help your body recover faster and more safely.  Start slowly and stop when getting sore.  If you can walk 30 minutes without stopping or pain, you can try more intense activity (running, jogging, aerobics, cycling, swimming, treadmill, sex, sports, weightlifting, etc.) Remember: If it hurts to do it, then don't do it! Use Ice and/or  Heat You will have swelling and bruising around the incisions.  This will take several weeks to resolve. Ice packs or heating pads (6-8 times a day, 30-60 minutes at a time) will help sooth soreness & bruising. Some people prefer to use ice alone, heat alone, or alternate between ice & heat.  Experiment and see what works best for you.  Consider trying ice for the first few days to help decrease swelling and bruising; then, switch to heat to help relax sore spots and speed recovery. Shower every day.  Short baths are fine.  It feels good!  Keep the incisions and wounds clean with soap & water.   Try Gentle Massage and/or Stretching Massage at the area of pain many times a day Stop if you feel pain - do not overdo it Take over the counter pain medication This helps the muscle and nerve tissues become less irritable and calm down faster Choose ONE of the following over-the-counter anti-inflammatory medications: Acetaminophen 500mg tabs (Tylenol) 1-2 pills with every meal and just before bedtime (avoid if you have liver problems or   if you have acetaminophen in you narcotic prescription) Naproxen 220mg tabs (ex. Aleve, Naprosyn) 1-2 pills twice a day (avoid if you have kidney, stomach, IBD, or bleeding problems) Ibuprofen 200mg tabs (ex. Advil, Motrin) 3-4 pills with every meal and just before bedtime (avoid if you have kidney, stomach, IBD, or bleeding problems) Take with food/snack several times a day as directed for at least 2 weeks to help keep pain / soreness down & more manageable. Take Narcotic prescription pain medication for more severe pain A prescription for strong pain control is often given to you upon discharge (for example: oxycodone/Percocet, hydrocodone/Norco/Vicodin, or tramadol/Ultram) Take your pain medication as prescribed. Be mindful that most narcotic prescriptions contain Tylenol (acetaminophen) as well - avoid taking too much Tylenol. If you are having problems/concerns with  the prescription medicine (does not control pain, nausea, vomiting, rash, itching, etc.), please call us [phone] to see if we need to switch you to a different pain medicine that will work better for you and/or control your side effects better. If you need a refill on your pain medication, you must call the office before 4 pm and on weekdays only.  By federal law, prescriptions for narcotics cannot be called into a pharmacy.  They must be filled out on paper & picked up from our office by the patient or authorized caretaker.  Prescriptions cannot be filled after 4 pm nor on weekends.    WHEN TO CALL US [phone] Severe uncontrolled or worsening pain  Fever over 101 F (38.5 C) Concerns with the incision: Worsening pain, redness, rash/hives, swelling, bleeding, or drainage Reactions / problems with new medications (itching, rash, hives, nausea, etc.) Nausea and/or vomiting Difficulty urinating Difficulty breathing Worsening fatigue, dizziness, lightheadedness, blurred vision Other concerns If you are not getting better after two weeks or are noticing you are getting worse, contact our office [phone] for further advice.  We may need to adjust your medications, re-evaluate you in the office, send you to the emergency room, or see what other things we can do to help. The clinic staff is available to answer your questions during regular business hours (8:30am-5pm).  Please don't hesitate to call and ask to speak to one of our nurses for clinical concerns.    A surgeon from [facility] is always on call at the hospitals 24 hours/day If you have a medical emergency, go to the nearest emergency room or call 911.  FOLLOW UP in our office One the day of your discharge from the hospital (or the next business weekday), please call [facility] to set up or confirm an appointment to see your surgeon in the office for a follow-up appointment.  Usually it is 2-3 weeks  after your surgery.   If you have skin staples at your incision(s), let the office know so we can set up a time in the office for the nurse to remove them (usually around 10 days after surgery). Make sure that you call for appointments the day of discharge (or the next business weekday) from the hospital to ensure a convenient appointment time. IF YOU HAVE DISABILITY OR FAMILY LEAVE FORMS, BRING THEM TO THE OFFICE FOR PROCESSING.  DO NOT GIVE THEM TO YOUR DOCTOR.  [facility] [address], [address], [place], [place]  [address] ? [phone] - Main [phone] - Toll Free,  [phone] - Fax [email or web address]    GETTING TO GOOD BOWEL HEALTH. It is expected for your   digestive tract to need a few months to get back to normal.  It is common for your bowel movements and stools to be irregular.  You will have occasional bloating and cramping that should eventually fade away.  Until you are eating solid food normally, off all pain medications, and back to regular activities; your bowels will not be normal.   Avoiding constipation The goal: ONE SOFT BOWEL MOVEMENT A DAY!    Drink plenty of fluids.  Choose water first. TAKE A FIBER SUPPLEMENT EVERY DAY THE REST OF YOUR LIFE During your first week back home, gradually add back a fiber supplement every day Experiment which form you can tolerate.   There are many forms such as powders, tablets, wafers, gummies, etc Psyllium bran (Metamucil), methylcellulose (Citrucel), Miralax or Glycolax, Benefiber, Flax Seed.  Adjust the dose week-by-week (1/2 dose/day to 6 doses a day) until you are moving your bowels 1-2 times a day.  Cut back the dose or try a different fiber product if it is giving you problems such as diarrhea or bloating. Sometimes a laxative is needed to help jump-start bowels if constipated until the fiber supplement can help regulate your bowels.  If you are tolerating eating & you are farting, it  is okay to try a gentle laxative such as double dose MiraLax, prune juice, or Milk of Magnesia.  Avoid using laxatives too often. Stool softeners can sometimes help counteract the constipating effects of narcotic pain medicines.  It can also cause diarrhea, so avoid using for too long. If you are still constipated despite taking fiber daily, eating solids, and a few doses of laxatives, call our office. Controlling diarrhea Try drinking liquids and eating bland foods for a few days to avoid stressing your intestines further. Avoid dairy products (especially milk & ice cream) for a short time.  The intestines often can lose the ability to digest lactose when stressed. Avoid foods that cause gassiness or bloating.  Typical foods include beans and other legumes, cabbage, broccoli, and dairy foods.  Avoid greasy, spicy, fast foods.  Every person has some sensitivity to other foods, so listen to your body and avoid those foods that trigger problems for you. Probiotics (such as active yogurt, Align, etc) may help repopulate the intestines and colon with normal bacteria and calm down a sensitive digestive tract Adding a fiber supplement gradually can help thicken stools by absorbing excess fluid and retrain the intestines to act more normally.  Slowly increase the dose over a few weeks.  Too much fiber too soon can backfire and cause cramping & bloating. It is okay to try and slow down diarrhea with a few doses of antidiarrheal medicines.   Bismuth subsalicylate (ex. Kayopectate, Pepto Bismol) for a few doses can help control diarrhea.  Avoid if pregnant.   Loperamide (Imodium) can slow down diarrhea.  Start with one tablet (2mg) first.  Avoid if you are having fevers or severe pain.  ILEOSTOMY PATIENTS WILL HAVE CHRONIC DIARRHEA since their colon is not in use.    Drink plenty of liquids.  You will need to drink even more glasses of water/liquid a day to avoid getting dehydrated. Record output from your  ileostomy.  Expect to empty the bag every 3-4 hours at first.  Most people with a permanent ileostomy empty their bag 4-6 times at the least.   Use antidiarrheal medicine (especially Imodium) several times a day to avoid getting dehydrated.  Start with a dose at bedtime & breakfast.    Adjust up or down as needed.  Increase antidiarrheal medications as directed to avoid emptying the bag more than 8 times a day (every 3 hours). Work with your wound ostomy nurse to learn care for your ostomy.  See ostomy care instructions. TROUBLESHOOTING IRREGULAR BOWELS 1) Start with a soft & bland diet. No spicy, greasy, or fried foods.  2) Avoid gluten/wheat or dairy products from diet to see if symptoms improve. 3) Miralax 17gm or flax seed mixed in 8oz. water or juice-daily. May use 2-4 times a day as needed. 4) Gas-X, Phazyme, etc. as needed for gas & bloating.  5) Prilosec (omeprazole) over-the-counter as needed 6)  Consider probiotics (Align, Activa, etc) to help calm the bowels down  Call your doctor if you are getting worse or not getting better.  Sometimes further testing (cultures, endoscopy, X-ray studies, CT scans, bloodwork, etc.) may be needed to help diagnose and treat the cause of the diarrhea. [facility] [address], [address], [place], [place]  [address] [phone] - Main.    [phone]  - Toll Free.   [phone] - Fax [email or web address]  

## 2021-08-28 NOTE — Progress Notes (Signed)
Rebecca Hurley [id number] 1940-07-27  CARE TEAM:  PCP: Malka So., MD  Outpatient Care Team: Patient Care Team: Malka So., MD as PCP - General (Internal Medicine) Elise Benne Ellery Plunk (Cardiology)  Inpatient Treatment Team: Treatment Team: Attending Provider: Meredeth Ide, MD; Consulting Physician: Montez Morita, Md, MD; Rounding Team: Shanda Howells, MD; Pharmacist: Herby Abraham, [facility]; Utilization Review: Clifton James, RN; Registered Nurse: Sanda Klein, RN; Social Worker: Holley Raring; Mobility Specialist: Lorina Rabon   Problem List:   Principal Problem:   SBO (small bowel obstruction) (HCC) Active Problems:   HTN (hypertension)   UTI (urinary tract infection)   Depression       Assessment  Persistent small bowel obstruction despite small bowel protocol  [facility] Stay = 3 days)  Plan:  -Is been over 72 hours since admission with small bowel protocol nasogastric tube decompression and rehydration.  I think she is exhausted nonoperative management.  Standard of care is to offer operative exploration.  Reasonable start out with laparoscopic lysed adhesions and but low threshold to convert to open.  Hopefully can do most of the work laparoscopically and then just put a wound protector through the hernia and go from there.  We will see.  She does have a low midline incisional hernia but it appears small and reducible with decompressed bowel.  Might be wise to try and avoid operating on the region.  Looking at scans transition zone appears to be in the low midline retroperitoneum.  Not at the anastomosis but we will do our due diligence.  Did caution that she is at risk for needing a small bowel resection if there is a obvious stricture there.  We will see.  Her operative risks are increased given her advanced age but she has decent performance status, walking the hallway.  Discussed with Dr. Sharl Ma who is the hospitalist assuming care for her this week and  he feels that she is a reasonable risk for surgery and agrees that she has exhausted nonoperative management.  Patient is open to do it.  She was hard of hearing so I took extra time to talk to her.  Much of her family is out of town but she has a grandson within 1/2-hour he wishes to come up here and be available.  The anatomy & physiology of the digestive tract was discussed.  The pathophysiology of intestinal obstruction was discussed.  Natural history risks without surgery was discussed.   I feel the patient has failed non-operative therapies.  The risks of no intervention will lead to serious problems such as necrosis, perforation, dehydration, etc. that outweigh the operative risks; therefore, I recommended abdominal exploration to diagnose & treat the source of the problem.  Minimally Invasive & open techniques were discussed.   I expressed a good likelihood that surgery will treat the problem.  Risks such as bleeding, infection, abscess, leak, reoperation, bowel resection, possible ostomy, hernia, injury to other organs, need for repair of tissues / organs, need for further treatment, heart attack, death, and other risks were discussed.   I noted a good likelihood this will help address the problem.  Goals of post-operative recovery were discussed as well.  We will work to minimize complications. Questions were answered.  The patient expresses understanding & wishes to proceed with surgery.   -VTE prophylaxis- SCDs, etc -mobilize as tolerated to help recovery  Disposition: TBD       I reviewed nursing notes, ED  provider notes, hospitalist notes, last 24 h vitals and pain scores, last 48 h intake and output, last 24 h labs and trends, and last 24 h imaging results. I have reviewed this patient's available data, including medical history, events of note, test results, etc as part of my evaluation.  A significant portion of that time was spent in counseling.  Care during the described time  interval was provided by me.  This care required moderate level of medical decision making.  08/28/2021    Subjective: (Chief complaint)  Hates the nasogastric tube and understands its importance.  Feels a little more distended today.  No abdominal pain.  No flatus or bowel movement.  Walking the hallways with minimal assist.  Dr. Darrick Meigs with [facility] hospitalist internal medicine primary service on the floor.  He had just seen the patient.  Objective:  Vital signs:  Vitals:   08/27/21 2057 08/27/21 2200 08/27/21 2329 08/28/21 0505  BP: (!) 190/75 (!) 170/75 (!) 147/67 (!) 156/70  Pulse: 62 69 (!) 59 64  Resp: 18 18 17 18   Temp: 97.6 F (36.4 C) 97.8 F (36.6 C) 98.4 F (36.9 C) 98.3 F (36.8 C)  TempSrc: Oral Oral Oral Oral  SpO2: 97% 96% 92% 96%  Weight:      Height:        Last BM Date : 08/24/21  Intake/Output   Yesterday:  08/20 0701 - 08/21 0700 In: 1910.9 [I.V.:1830.9; NG/GT:30; IV Piggyback:50] Out: 850 [Urine:100; Emesis/NG output:750] This shift:  No intake/output data recorded.  Bowel function:  Flatus: No  BM:  No  Drain: Nasogastric tube in place with thick oil can/molasses consistency effluent.   Physical Exam:  General: Pt awake/alert in no acute distress Eyes: PERRL, normal EOM.  Sclera clear.  No icterus Neuro: CN II-XII intact w/o focal sensory/motor deficits. Lymph: No head/neck/groin lymphadenopathy Psych:  No delerium/psychosis/paranoia.  Oriented x 4 HENT: Normocephalic, Mucus membranes moist.  No thrush Neck: Supple, No tracheal deviation.  No obvious thyromegaly Chest: No pain to chest wall compression.  Good respiratory excursion.  No audible wheezing CV:  Pulses intact.  Regular rhythm.  No major extremity edema MS: Normal AROM mjr joints.  No obvious deformity  Abdomen: Soft.  Moderately distended.  Nontender.  No evidence of peritonitis.  Can palpate a lower midline hernia going to the right side but is soft and reducible.     Ext:   No deformity.  No mjr edema.  No cyanosis Skin: No petechiae / purpurea.  No major sores.  Warm and dry    Results:   Cultures: Recent Results (from the past 720 hour(s))  Urine Culture     Status: Abnormal   Collection Time: 08/25/21 12:13 AM   Specimen: Urine, Clean Catch  Result Value Ref Range Status   Specimen Description   Final    URINE, CLEAN CATCH Performed at [facility], [place] [address], [place], [place] [address]    Special Requests   Final    NONE Performed at [facility], [place] [address], [place], [place] [address]    Culture >=100,000 COLONIES/mL ESCHERICHIA COLI (A)  Final   Report Status 08/27/2021 FINAL  Final   Organism ID, Bacteria ESCHERICHIA COLI (A)  Final      Susceptibility   Escherichia coli - MIC*    AMPICILLIN >=32 RESISTANT Resistant     CEFAZOLIN <=4 SENSITIVE Sensitive     CEFEPIME <=0.12 SENSITIVE Sensitive  CEFTRIAXONE <=0.25 SENSITIVE Sensitive     CIPROFLOXACIN <=0.25 SENSITIVE Sensitive     GENTAMICIN >=16 RESISTANT Resistant     IMIPENEM <=0.25 SENSITIVE Sensitive     NITROFURANTOIN <=16 SENSITIVE Sensitive     TRIMETH/SULFA >=320 RESISTANT Resistant     AMPICILLIN/SULBACTAM 16 INTERMEDIATE Intermediate     PIP/TAZO <=4 SENSITIVE Sensitive     * >=100,000 COLONIES/mL ESCHERICHIA COLI  Surgical PCR screen     Status: None   Collection Time: 08/28/21  5:26 AM   Specimen: Nasal Mucosa; Nasal Swab  Result Value Ref Range Status   MRSA, PCR NEGATIVE NEGATIVE Final   Staphylococcus aureus NEGATIVE NEGATIVE Final    Comment: (NOTE) The Xpert SA Assay (FDA approved for NASAL specimens in patients 74 years of age and older), is one component of a comprehensive surveillance program. It is not intended to diagnose infection nor to guide or monitor treatment. Performed at [facility], [place] [address] [place], [place] [address]     Labs: Results for orders placed or  performed during the hospital encounter of 08/24/21 (from the past 48 hour(s))  Basic metabolic panel     Status: Abnormal   Collection Time: 08/27/21  4:26 AM  Result Value Ref Range   Sodium 144 135 - 145 mmol/L   Potassium 3.5 3.5 - 5.1 mmol/L   Chloride 110 98 - 111 mmol/L   CO2 26 22 - 32 mmol/L   Glucose, Bld 104 (H) 70 - 99 mg/dL    Comment: Glucose reference range applies only to samples taken after fasting for at least 8 hours.   BUN 24 (H) 8 - 23 mg/dL   Creatinine, Ser 0.66 0.44 - 1.00 mg/dL   Calcium 8.8 (L) 8.9 - 10.3 mg/dL   GFR, Estimated >60 >60 mL/min    Comment: (NOTE) Calculated using the CKD-EPI Creatinine Equation (2021)    Anion gap 8 5 - 15    Comment: Performed at [facility], [place] [address], [place], [place] [address]  Glucose, capillary     Status: Abnormal   Collection Time: 08/27/21  8:51 PM  Result Value Ref Range   Glucose-Capillary 137 (H) 70 - 99 mg/dL    Comment: Glucose reference range applies only to samples taken after fasting for at least 8 hours.  Surgical PCR screen     Status: None   Collection Time: 08/28/21  5:26 AM   Specimen: Nasal Mucosa; Nasal Swab  Result Value Ref Range   MRSA, PCR NEGATIVE NEGATIVE   Staphylococcus aureus NEGATIVE NEGATIVE    Comment: (NOTE) The Xpert SA Assay (FDA approved for NASAL specimens in patients 71 years of age and older), is one component of a comprehensive surveillance program. It is not intended to diagnose infection nor to guide or monitor treatment. Performed at [facility], [place] [address], [place],  [address]     Imaging / Studies: DG Abd 1 View  Result Date: 08/27/2021 CLINICAL DATA:  Small bowel obstruction EXAM: ABDOMEN - 1 VIEW COMPARISON:  08/25/2021 FINDINGS: Multiple dilated loops of bowel measuring up to 7 cm consistent with persistent small-bowel obstruction. No evidence of pneumoperitoneum, portal venous gas or pneumatosis. Nasogastric  tube with the tip projecting over the stomach. No pathologic calcifications along the expected course of the ureters. No acute osseous abnormality. IMPRESSION: 1. Persistent small-bowel obstruction. Electronically Signed   By: Kathreen Devoid M.D.   On: 08/27/2021 09:16    Medications / Allergies:  per chart  Antibiotics: Anti-infectives (From admission, onward)    Start     Dose/Rate Route Frequency Ordered Stop   08/27/21 2200  ceFAZolin (ANCEF) IVPB 1 g/50 mL premix        1 g 100 mL/hr over 30 Minutes Intravenous Every 8 hours 08/27/21 1331     08/26/21 0200  cefTRIAXone (ROCEPHIN) 1 g in sodium chloride 0.9 % 100 mL IVPB  Status:  Discontinued        1 g 200 mL/hr over 30 Minutes Intravenous Every 24 hours 08/25/21 0324 08/27/21 1331   08/25/21 0115  cefTRIAXone (ROCEPHIN) 1 g in sodium chloride 0.9 % 100 mL IVPB        1 g 200 mL/hr over 30 Minutes Intravenous  Once 08/25/21 0114 08/25/21 4315         Note: Portions of this report may have been transcribed using voice recognition software. Every effort was made to ensure accuracy; however, inadvertent computerized transcription errors may be present.   Any transcriptional errors that result from this process are unintentional.    Ardeth Sportsman, MD, FACS, MASCRS Esophageal, Gastrointestinal & Colorectal Surgery Robotic and Minimally Invasive Surgery  [facility] [address] [place], [place] [address] [phone] Fax [phone] Main  CONTACT INFORMATION:  Weekday (9AM-5PM): Call [facility] main office at [phone]  Weeknight (5PM-9AM) or Weekend/Holiday: Check [email or web address] (password " [email or web address]") for General Surgery [facility] coverage  (Please, do not use SecureChat as it is not reliable communication to reach operating surgeons for immediate patient care)      08/28/2021  8:57 AM

## 2021-08-28 NOTE — Transfer of Care (Signed)
Immediate Anesthesia Transfer of Care Note  Patient: Marilea S Doolen  Procedure(s) Performed: EXPLORATORY LAPAROTOMY, LYSIS OF ADHESIONS, SMALL BOWEL RESECTION, PRIMARY INCISIONAL HERNIA REPAIR  Patient Location: PACU  Anesthesia Type:General  Level of Consciousness: awake, alert  and patient cooperative  Airway & Oxygen Therapy: Patient Spontanous Breathing and Patient connected to face mask oxygen  Post-op Assessment: Report given to RN, Post -op Vital signs reviewed and stable and Patient moving all extremities X 4  Post vital signs: Reviewed and stable  Last Vitals:  Vitals Value Taken Time  BP 195/107 08/28/21 1607  Temp    Pulse 66 08/28/21 1611  Resp 22 08/28/21 1611  SpO2 98 % 08/28/21 1611  Vitals shown include unvalidated device data.  Last Pain:  Vitals:   08/28/21 1130  TempSrc: Oral  PainSc: 0-No pain      Patients Stated Pain Goal: 0 (08/28/21 0900)  Complications: No notable events documented.

## 2021-08-29 ENCOUNTER — Encounter (HOSPITAL_COMMUNITY): Payer: Self-pay | Admitting: Surgery

## 2021-08-29 DIAGNOSIS — N390 Urinary tract infection, site not specified: Secondary | ICD-10-CM | POA: Diagnosis not present

## 2021-08-29 DIAGNOSIS — I471 Supraventricular tachycardia: Secondary | ICD-10-CM

## 2021-08-29 DIAGNOSIS — K56609 Unspecified intestinal obstruction, unspecified as to partial versus complete obstruction: Secondary | ICD-10-CM | POA: Diagnosis not present

## 2021-08-29 DIAGNOSIS — I159 Secondary hypertension, unspecified: Secondary | ICD-10-CM | POA: Diagnosis not present

## 2021-08-29 LAB — BASIC METABOLIC PANEL
Anion gap: 7 (ref 5–15)
Anion gap: 7 (ref 5–15)
BUN: 14 mg/dL (ref 8–23)
BUN: 14 mg/dL (ref 8–23)
CO2: 27 mmol/L (ref 22–32)
CO2: 27 mmol/L (ref 22–32)
Calcium: 8.1 mg/dL — ABNORMAL LOW (ref 8.9–10.3)
Calcium: 8 mg/dL — ABNORMAL LOW (ref 8.9–10.3)
Chloride: 106 mmol/L (ref 98–111)
Chloride: 105 mmol/L (ref 98–111)
Creatinine, Ser: 0.77 mg/dL (ref 0.44–1.00)
Creatinine, Ser: 0.78 mg/dL (ref 0.44–1.00)
GFR, Estimated: >60 mL/min (ref >60)
GFR, Estimated: >60 mL/min (ref >60)
Glucose, Bld: 138 mg/dL — ABNORMAL HIGH (ref 70–99)
Glucose, Bld: 127 mg/dL — ABNORMAL HIGH (ref 70–99)
Potassium: 3.4 mmol/L — ABNORMAL LOW (ref 3.5–5.1)
Potassium: 3.2 mmol/L — ABNORMAL LOW (ref 3.5–5.1)
Sodium: 140 mmol/L (ref 135–145)
Sodium: 139 mmol/L (ref 135–145)

## 2021-08-29 LAB — CBC
HCT: 37.7 % (ref 36.0–46.0)
Hemoglobin: 12.3 g/dL (ref 12.0–15.0)
MCH: 30.1 pg (ref 26.0–34.0)
MCHC: 32.6 g/dL (ref 30.0–36.0)
MCV: 92.4 fL (ref 80.0–100.0)
Platelets: 233 10*3/uL (ref 150–400)
RBC: 4.08 MIL/uL (ref 3.87–5.11)
RDW: 13.5 % (ref 11.5–15.5)
WBC: 7.8 10*3/uL (ref 4.0–10.5)
nRBC: 0 % (ref 0.0–0.2)

## 2021-08-29 MED ORDER — LACTATED RINGERS IV BOLUS: 1000.0000 mL | Freq: Three times a day (TID) | INTRAVENOUS | Status: AC | PRN

## 2021-08-29 MED ORDER — SODIUM CHLORIDE 0.9 % IV SOLN: 250.0000 mL | INTRAVENOUS | Status: DC | PRN

## 2021-08-29 MED ORDER — SODIUM CHLORIDE 0.9% FLUSH
3.0000 mL | Freq: Two times a day (BID) | INTRAVENOUS | Status: DC
  Administered 2021-08-29 – 2021-09-04 (×6): 3 mL via INTRAVENOUS

## 2021-08-29 MED ORDER — METHOCARBAMOL 1000 MG/10ML IJ SOLN
500.0000 mg | Freq: Three times a day (TID) | INTRAVENOUS | Status: DC
  Administered 2021-08-29 – 2021-09-03 (×15): 500 mg via INTRAVENOUS
  Filled 2021-08-29 (×8): qty 500
  Filled 2021-08-29: qty 5
  Filled 2021-08-29 (×3): qty 500
  Filled 2021-08-29: qty 5
  Filled 2021-08-29 (×2): qty 500
  Filled 2021-08-29: qty 5

## 2021-08-29 MED ORDER — POTASSIUM CHLORIDE 10 MEQ/100ML IV SOLN
10.0000 meq | Freq: Once | INTRAVENOUS | Status: AC
  Administered 2021-08-29: 10 meq via INTRAVENOUS
  Filled 2021-08-29: qty 100

## 2021-08-29 MED ORDER — SODIUM CHLORIDE 0.9% FLUSH: 3.0000 mL | INTRAVENOUS | Status: DC | PRN

## 2021-08-29 NOTE — Progress Notes (Signed)
Rebecca Hurley [id number] 1940/05/12  CARE TEAM:  PCP: Malka So., MD  Outpatient Care Team: Patient Care Team: Malka So., MD as PCP - General (Internal Medicine) Elise Benne Ellery Plunk (Cardiology)  Inpatient Treatment Team: Treatment Team: Attending Provider: Meredeth Ide, MD; Consulting Physician: Montez Morita, Md, MD; Rounding Team: Shanda Howells, MD; Charge Nurse: Amil Amen, RN; Pharmacist: Herby Abraham, [facility]; Utilization Review: Clifton James, RN; Mobility Specialist: Lorina Rabon   Problem List:   Principal Problem:   SBO (small bowel obstruction) (HCC) Active Problems:   HTN (hypertension)   UTI (urinary tract infection)   Depression  POST-OPERATIVE DIAGNOSIS:   SMALL BOWEL OBSTRUCTION WITH NECROSIS INCISIONAL HERNIA   PROCEDURE:   LAPAROSCOPIC LYSIS OF ADHESIONS SMALL BOWEL RESECTION PRIMARY INCISIONAL HERNIA REPAIR   SURGEON:  Ardeth Sportsman, MD  OR FINDINGS: Transition point due to adhesive band between loops of jejunum distal to prior small bowel anastomosis.  Major inflammatory adhesive band source of transition point causing ischemia & a band of necrosis  Therefore small bowel resection done.   Low midline Swiss cheese type incisional hernia in the setting of diastases recti.  Small bowel resection done through hernia.  Incisional hernia primarily repaired with #1 PDS.   Assessment  Persistent small bowel obstruction despite small bowel protocol -recovering status post adhesions and small bowel resection 08/28/2021  [facility] Stay = 4 days)  Plan:  He medically stable I hopeful sign.  Continue nasogastric tube decompression until ileus resolves.  I cautioned patient takes 2-10 days afterwards.  We will see.  Palliate nasogastric tube with scheduled Magic mouthwash and other as needed medications.  IV antibiotics overnight from an abdominal surgery standpoint.  Defer need to continue cefazolin for UTI to primary service.  Nausea and  pain control.  We will add standing Robaxin since she is rather sore from her incisional hernia repair.  Try and mobilize more and get it for recovery.  I think she could benefit from having therapy evaluations given her advanced age and deconditioned state.  Follow-up on pathology but suspect this is benign etiology.  I updated the patient's status to the patient and nurse  Recommendations were made.  Questions were answered.  They expressed understanding & appreciation.     I reviewed nursing notes, ED provider notes, hospitalist notes, last 24 h vitals and pain scores, last 48 h intake and output, last 24 h labs and trends, and last 24 h imaging results. I have reviewed this patient's available data, including medical history, events of note, test results, etc as part of my evaluation.  A significant portion of that time was spent in counseling.  Care during the described time interval was provided by me.  This care required moderate level of medical decision making.  08/29/2021    Subjective: (Chief complaint)  Rather sore.  Received IV Dilaudid.  Surgical nurse in room.  A little disheartened but concedes abdomen less distended.  Objective:  Vital signs:  Vitals:   08/28/21 2215 08/28/21 2322 08/29/21 0006 08/29/21 0402  BP: (!) 168/76 (!) 189/71 (!) 152/61 (!) 159/73  Pulse:  65 68 68  Resp:   17 18  Temp:   99 F (37.2 C) 99.2 F (37.3 C)  TempSrc:   Oral Oral  SpO2:  99% 98% 98%  Weight:      Height:        Last BM Date : 08/24/21  Intake/Output   Yesterday:  08/21  0701 - 08/22 0700 In: 2495.4 [I.V.:2345.4; IV Piggyback:150] Out: 1820 [Urine:1360; Emesis/NG output:400; Blood:60] This shift:  No intake/output data recorded.  Bowel function:  Flatus: No  BM:  No  Drain: Nasogastric tube in place with light green effluent.   Physical Exam:  General: Pt awake/alert in no acute distress.  Tired but not toxic.  Slightly annoyed but mostly  consolable Eyes: PERRL, normal EOM.  Sclera clear.  No icterus Neuro: CN II-XII intact w/o focal sensory/motor deficits. Lymph: No head/neck/groin lymphadenopathy Psych:  No delerium/psychosis/paranoia.  Oriented x 4 HENT: Normocephalic, Mucus membranes moist.  No thrush Neck: Supple, No tracheal deviation.  No obvious thyromegaly Chest: No pain to chest wall compression.  Good respiratory excursion.  No audible wheezing CV:  Pulses intact.  Regular rhythm.  No major extremity edema MS: Normal AROM mjr joints.  No obvious deformity  Abdomen: Soft.  Mildy distended.  Mildly tender at incisions only.  No evidence of peritonitis.  Can palpate a lower midline hernia going to the right side but is soft and reducible.    Ext:   No deformity.  No mjr edema.  No cyanosis Skin: No petechiae / purpurea.  No major sores.  Warm and dry    Results:   Cultures: Recent Results (from the past 720 hour(s))  Urine Culture     Status: Abnormal   Collection Time: 08/25/21 12:13 AM   Specimen: Urine, Clean Catch  Result Value Ref Range Status   Specimen Description   Final    URINE, CLEAN CATCH Performed at [facility], [address], [place], [place] [address]    Special Requests   Final    NONE Performed at [facility], [address] [place], [place] [address]    Culture >=100,000 COLONIES/mL ESCHERICHIA COLI (A)  Final   Report Status 08/27/2021 FINAL  Final   Organism ID, Bacteria ESCHERICHIA COLI (A)  Final      Susceptibility   Escherichia coli - MIC*    AMPICILLIN >=32 RESISTANT Resistant     CEFAZOLIN <=4 SENSITIVE Sensitive     CEFEPIME <=0.12 SENSITIVE Sensitive     CEFTRIAXONE <=0.25 SENSITIVE Sensitive     CIPROFLOXACIN <=0.25 SENSITIVE Sensitive     GENTAMICIN >=16 RESISTANT Resistant     IMIPENEM <=0.25 SENSITIVE Sensitive     NITROFURANTOIN <=16 SENSITIVE Sensitive     TRIMETH/SULFA >=320 RESISTANT Resistant     AMPICILLIN/SULBACTAM  16 INTERMEDIATE Intermediate     PIP/TAZO <=4 SENSITIVE Sensitive     * >=100,000 COLONIES/mL ESCHERICHIA COLI  Surgical PCR screen     Status: None   Collection Time: 08/28/21  5:26 AM   Specimen: Nasal Mucosa; Nasal Swab  Result Value Ref Range Status   MRSA, PCR NEGATIVE NEGATIVE Final   Staphylococcus aureus NEGATIVE NEGATIVE Final    Comment: (NOTE) The Xpert SA Assay (FDA approved for NASAL specimens in patients 31 years of age and older), is one component of a comprehensive surveillance program. It is not intended to diagnose infection nor to guide or monitor treatment. Performed at [facility], [address] [place], [place] [address]     Labs: Results for orders placed or performed during the hospital encounter of 08/24/21 (from the past 48 hour(s))  Glucose, capillary     Status: Abnormal   Collection Time: 08/27/21  8:51 PM  Result Value Ref Range   Glucose-Capillary 137 (H) 70 - 99 mg/dL    Comment:  Glucose reference range applies only to samples taken after fasting for at least 8 hours.  Surgical PCR screen     Status: None   Collection Time: 08/28/21  5:26 AM   Specimen: Nasal Mucosa; Nasal Swab  Result Value Ref Range   MRSA, PCR NEGATIVE NEGATIVE   Staphylococcus aureus NEGATIVE NEGATIVE    Comment: (NOTE) The Xpert SA Assay (FDA approved for NASAL specimens in patients 84 years of age and older), is one component of a comprehensive surveillance program. It is not intended to diagnose infection nor to guide or monitor treatment. Performed at [facility], [address] [place], [place] [address]   Type and screen [facility]     Status: None   Collection Time: 08/28/21 10:14 AM  Result Value Ref Range   ABO/RH(D) O POS    Antibody Screen NEG    Sample Expiration      08/31/2021,2359 Performed at [facility], [address] [place], [place] [address]     Imaging /  Studies: No results found.  Medications / Allergies: per chart  Antibiotics: Anti-infectives (From admission, onward)    Start     Dose/Rate Route Frequency Ordered Stop   08/29/21 0600  cefoTEtan (CEFOTAN) 2 g in sodium chloride 0.9 % 100 mL IVPB  Status:  Discontinued        2 g 200 mL/hr over 30 Minutes Intravenous On call to O.R. 08/28/21 1243 08/28/21 1730   08/29/21 0600  ceFAZolin (ANCEF) IVPB 1 g/50 mL premix        1 g 100 mL/hr over 30 Minutes Intravenous Every 8 hours 08/28/21 1815 09/01/21 0559   08/28/21 2200  cefoTEtan (CEFOTAN) 2 g in sodium chloride 0.9 % 100 mL IVPB        2 g 200 mL/hr over 30 Minutes Intravenous Every 12 hours 08/28/21 1815 08/28/21 2127   08/27/21 2200  ceFAZolin (ANCEF) IVPB 1 g/50 mL premix  Status:  Discontinued        1 g 100 mL/hr over 30 Minutes Intravenous Every 8 hours 08/27/21 1331 08/28/21 1815   08/26/21 0200  cefTRIAXone (ROCEPHIN) 1 g in sodium chloride 0.9 % 100 mL IVPB  Status:  Discontinued        1 g 200 mL/hr over 30 Minutes Intravenous Every 24 hours 08/25/21 0324 08/27/21 1331   08/25/21 0115  cefTRIAXone (ROCEPHIN) 1 g in sodium chloride 0.9 % 100 mL IVPB        1 g 200 mL/hr over 30 Minutes Intravenous  Once 08/25/21 0114 08/25/21 3546         Note: Portions of this report may have been transcribed using voice recognition software. Every effort was made to ensure accuracy; however, inadvertent computerized transcription errors may be present.   Any transcriptional errors that result from this process are unintentional.    Ardeth Sportsman, MD, FACS, MASCRS Esophageal, Gastrointestinal & Colorectal Surgery Robotic and Minimally Invasive Surgery  [facility] [address] [place], [place] [address] [phone] Fax [phone] Main  CONTACT INFORMATION:  Weekday (9AM-5PM): Call [facility] main office at [phone]  Weeknight (5PM-9AM) or  Weekend/Holiday: Check [email or web address] (password " [email or web address]") for General Surgery [facility] coverage  (Please, do not use SecureChat as it is not reliable communication to reach operating surgeons for immediate patient care)      08/29/2021  9:00 AM
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
## 2021-08-29 NOTE — Progress Notes (Signed)
Mobility Specialist - Progress Note   08/29/21 1006  Mobility  HOB Elevated/Bed Position Self regulated  Activity Ambulated with assistance in room  Range of Motion/Exercises Active  Level of Assistance Minimal assist, patient does 75% or more  Assistive Device Front wheel walker  Distance Ambulated (ft) 20 ft  Activity Response Tolerated well  Transport method Ambulatory  $Mobility charge 1 Mobility   Pt received in bed & agreeable to mobility. Pt c/o of weakness during ambulation. Pt in recliner w/ necessities in reach.     [facility]

## 2021-08-29 NOTE — Care Management Important Message (Signed)
Important Message  Patient Details IM Letter given to the Patient. Name: Rebecca Hurley MRN: [id number] Date of Birth: 01-22-1940   Medicare Important Message Given:  Yes     Caren Macadam 08/29/2021, 9:24 AM
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
## 2021-08-30 ENCOUNTER — Inpatient Hospital Stay (HOSPITAL_COMMUNITY): Payer: Medicare Other

## 2021-08-30 DIAGNOSIS — I471 Supraventricular tachycardia: Secondary | ICD-10-CM

## 2021-08-30 DIAGNOSIS — D649 Anemia, unspecified: Secondary | ICD-10-CM

## 2021-08-30 DIAGNOSIS — F32A Depression, unspecified: Secondary | ICD-10-CM | POA: Diagnosis not present

## 2021-08-30 DIAGNOSIS — K56609 Unspecified intestinal obstruction, unspecified as to partial versus complete obstruction: Secondary | ICD-10-CM | POA: Diagnosis not present

## 2021-08-30 DIAGNOSIS — I159 Secondary hypertension, unspecified: Secondary | ICD-10-CM | POA: Diagnosis not present

## 2021-08-30 LAB — COMPREHENSIVE METABOLIC PANEL
ALT: 11 U/L (ref 0–44)
AST: 17 U/L (ref 15–41)
Albumin: 2.2 g/dL — ABNORMAL LOW (ref 3.5–5.0)
Alkaline Phosphatase: 39 U/L (ref 38–126)
Anion gap: 6 (ref 5–15)
BUN: 13 mg/dL (ref 8–23)
CO2: 26 mmol/L (ref 22–32)
Calcium: 7.9 mg/dL — ABNORMAL LOW (ref 8.9–10.3)
Chloride: 104 mmol/L (ref 98–111)
Creatinine, Ser: 0.59 mg/dL (ref 0.44–1.00)
GFR, Estimated: >60 mL/min (ref >60)
Glucose, Bld: 105 mg/dL — ABNORMAL HIGH (ref 70–99)
Potassium: 3.3 mmol/L — ABNORMAL LOW (ref 3.5–5.1)
Sodium: 136 mmol/L (ref 135–145)
Total Bilirubin: 0.5 mg/dL (ref 0.3–1.2)
Total Protein: 5 g/dL — ABNORMAL LOW (ref 6.5–8.1)

## 2021-08-30 LAB — CBC
HCT: 32.4 % — ABNORMAL LOW (ref 36.0–46.0)
Hemoglobin: 10.9 g/dL — ABNORMAL LOW (ref 12.0–15.0)
MCH: 30.6 pg (ref 26.0–34.0)
MCHC: 33.6 g/dL (ref 30.0–36.0)
MCV: 91 fL (ref 80.0–100.0)
Platelets: 203 10*3/uL (ref 150–400)
RBC: 3.56 MIL/uL — ABNORMAL LOW (ref 3.87–5.11)
RDW: 13.5 % (ref 11.5–15.5)
WBC: 9.3 10*3/uL (ref 4.0–10.5)
nRBC: 0 % (ref 0.0–0.2)

## 2021-08-30 LAB — SURGICAL PATHOLOGY

## 2021-08-30 LAB — PHOSPHORUS: Phosphorus: 1.8 mg/dL — ABNORMAL LOW (ref 2.5–4.6)

## 2021-08-30 LAB — MAGNESIUM: Magnesium: 1.8 mg/dL (ref 1.7–2.4)

## 2021-08-30 MED ORDER — MAGNESIUM SULFATE 2 GM/50ML IV SOLN
2.0000 g | Freq: Once | INTRAVENOUS | Status: AC
  Administered 2021-08-30: 2 g via INTRAVENOUS
  Filled 2021-08-30: qty 50

## 2021-08-30 MED ORDER — POTASSIUM PHOSPHATES 15 MMOLE/5ML IV SOLN
20.0000 mmol | Freq: Once | INTRAVENOUS | Status: AC
  Administered 2021-08-30: 20 mmol via INTRAVENOUS
  Filled 2021-08-30: qty 6.67

## 2021-08-30 MED ORDER — POTASSIUM CHLORIDE 10 MEQ/100ML IV SOLN
10.0000 meq | INTRAVENOUS | Status: AC
  Administered 2021-08-30 (×4): 10 meq via INTRAVENOUS
  Filled 2021-08-30 (×3): qty 100
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
## 2021-08-30 NOTE — Progress Notes (Signed)
Mobility Specialist Cancellation/Refusal Note:   Reason for Cancellation/Refusal: Pt declined mobility at this time. Will check back as schedule permits.      [facility]
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
## 2021-08-30 NOTE — Progress Notes (Signed)
Rebecca Hurley [id number] Feb 25, 1940  CARE TEAM:  PCP: Lilian Coma., MD  Outpatient Care Team: Patient Care Team: Lilian Coma., MD as PCP - General (Internal Medicine) Lennice Sites Clarisse Gouge (Cardiology)  Inpatient Treatment Team: Treatment Team: Attending Provider: Kerney Elbe, DO; Consulting Physician: Edison Pace, Md, MD; Rounding Team: Fatima Blank, MD; Charge Nurse: Charlyne Petrin, RN; Mobility Specialist: Trevor Mace; Social Worker: Evans Lance; Pharmacist: Eudelia Bunch, [facility]   Problem List:   Principal Problem:   SBO (small bowel obstruction) ([place]) Active Problems:   HTN (hypertension)   UTI (urinary tract infection)   Depression   Junctional tachycardia ([place])  POST-OPERATIVE DIAGNOSIS:   SMALL BOWEL OBSTRUCTION WITH NECROSIS INCISIONAL HERNIA   PROCEDURE:   LAPAROSCOPIC LYSIS OF ADHESIONS SMALL BOWEL RESECTION PRIMARY INCISIONAL HERNIA REPAIR   SURGEON:  Adin Hector, MD  OR FINDINGS: Transition point due to adhesive band between loops of jejunum distal to prior small bowel anastomosis.  Major inflammatory adhesive band source of transition point causing ischemia & a band of necrosis  Therefore small bowel resection done.   Low midline Swiss cheese type incisional hernia in the setting of diastases recti.  Small bowel resection done through hernia.  Incisional hernia primarily repaired with #1 PDS.   Assessment  Persistent small bowel obstruction recovering status post adhesions and small bowel resection 08/28/2021  [facility] Stay = 5 days)  Plan:  Medically stable a hopeful sign.  Continue nasogastric tube decompression until ileus resolves.  I cautioned patient takes 2-10 days afterwards.  She seems frustrated and disappointed but I think ultimately understands its importance.  We will see.  Palliate nasogastric tube with scheduled Magic mouthwash and other as needed medications.  IV antibiotics overnight from an abdominal  surgery standpoint.  Defer need to continue cefazolin for UTI to primary service.  Nausea and pain control.  We will add standing Robaxin since she is rather sore from her incisional hernia repair.  Try and mobilize more and get it for recovery.  The fact that she walked 3 times in the hallway is a good start.  I tried to give her congrats for that.  I think she could benefit from having therapy evaluations given her advanced age and deconditioned state.  Follow-up on pathology notes that this is all benign etiology.  No tumor or mass.    I updated the patient's status to the patient and nurse  Recommendations were made.  Questions were answered.  They expressed understanding & appreciation.     I reviewed nursing notes, ED provider notes, hospitalist notes, last 24 h vitals and pain scores, last 48 h intake and output, last 24 h labs and trends, and last 24 h imaging results. I have reviewed this patient's available data, including medical history, events of note, test results, etc as part of my evaluation.  A significant portion of that time was spent in counseling.  Care during the described time interval was provided by me.  This care required moderate level of medical decision making.  08/30/2021    Subjective: (Chief complaint)  Walked 3 times in hallways.  Annoyed that nasogastric tube is still in and that she is not normal yet.  Pain less.  Nurse in room.  Objective:  Vital signs:  Vitals:   08/29/21 2113 08/29/21 2329 08/30/21 0502 08/30/21 1310  BP: (!) 165/71 138/70 (!) 142/77 (!) 152/67  Pulse: 77 78 86 77  Resp: 18 17 16  17  Temp: 98.1 F (36.7 C) 98 F (36.7 C) 98.4 F (36.9 C) (!) 101.3 F (38.5 C)  TempSrc: Oral Oral Oral Oral  SpO2: 93%  91% 96%  Weight:      Height:        Last BM Date : 08/24/21  Intake/Output   Yesterday:  08/22 0701 - 08/23 0700 In: 374.3 [P.O.:60; IV Piggyback:314.3] Out: 650 [Urine:300; Emesis/NG output:350] This  shift:  Total I/O In: 0  Out: 300 [Emesis/NG output:300]  Bowel function:  Flatus: No  BM:  No  Drain: Nasogastric tube in place with light green effluent.- more thin   Physical Exam:  General: Pt awake/alert in no acute distress.  Tired but not toxic.  Slightly annoyed but mostly consolable Eyes: PERRL, normal EOM.  Sclera clear.  No icterus Neuro: CN II-XII intact w/o focal sensory/motor deficits. Lymph: No head/neck/groin lymphadenopathy Psych:  No delerium/psychosis/paranoia.  Oriented x 4 HENT: Normocephalic, Mucus membranes moist.  No thrush Neck: Supple, No tracheal deviation.  No obvious thyromegaly Chest: No pain to chest wall compression.  Good respiratory excursion.  No audible wheezing CV:  Pulses intact.  Regular rhythm.  No major extremity edema MS: Normal AROM mjr joints.  No obvious deformity  Abdomen: Soft.  Mildy distended.   Minimal tenderness at incision.  Markedly improved. .  No evidence of peritonitis.  Can palpate a lower midline hernia going to the right side but is soft and reducible.    Ext:   No deformity.  No mjr edema.  No cyanosis Skin: No petechiae / purpurea.  No major sores.  Warm and dry    Results:   Cultures: Recent Results (from the past 720 hour(s))  Urine Culture     Status: Abnormal   Collection Time: 08/25/21 12:13 AM   Specimen: Urine, Clean Catch  Result Value Ref Range Status   Specimen Description   Final    URINE, CLEAN CATCH Performed at [facility], [address] [place], [place] [address]    Special Requests   Final    NONE Performed at [facility], [address], [place], [place] [address]    Culture >=100,000 COLONIES/mL ESCHERICHIA COLI (A)  Final   Report Status 08/27/2021 FINAL  Final   Organism ID, Bacteria ESCHERICHIA COLI (A)  Final      Susceptibility   Escherichia coli - MIC*    AMPICILLIN >=32 RESISTANT Resistant     CEFAZOLIN <=4 SENSITIVE Sensitive      CEFEPIME <=0.12 SENSITIVE Sensitive     CEFTRIAXONE <=0.25 SENSITIVE Sensitive     CIPROFLOXACIN <=0.25 SENSITIVE Sensitive     GENTAMICIN >=16 RESISTANT Resistant     IMIPENEM <=0.25 SENSITIVE Sensitive     NITROFURANTOIN <=16 SENSITIVE Sensitive     TRIMETH/SULFA >=320 RESISTANT Resistant     AMPICILLIN/SULBACTAM 16 INTERMEDIATE Intermediate     PIP/TAZO <=4 SENSITIVE Sensitive     * >=100,000 COLONIES/mL ESCHERICHIA COLI  Surgical PCR screen     Status: None   Collection Time: 08/28/21  5:26 AM   Specimen: Nasal Mucosa; Nasal Swab  Result Value Ref Range Status   MRSA, PCR NEGATIVE NEGATIVE Final   Staphylococcus aureus NEGATIVE NEGATIVE Final    Comment: (NOTE) The Xpert SA Assay (FDA approved for NASAL specimens in patients 3 years of age and older), is one component of a comprehensive surveillance program. It is not intended to diagnose infection nor to guide or monitor treatment. Performed at [facility]  [place] [address], [place], [place] [address]     Labs: Results for orders placed or performed during the hospital encounter of 08/24/21 (from the past 48 hour(s))  Surgical pathology     Status: None   Collection Time: 08/28/21  2:50 PM  Result Value Ref Range   SURGICAL PATHOLOGY      SURGICAL PATHOLOGY CASE: [id number] PATIENT: Renella Cunas Surgical Pathology Report     Clinical History: Bowel obstruction; Old anastomosis stitch marks necrotic bowel (jmc)     FINAL MICROSCOPIC DIAGNOSIS:  A. JEJUNUM, RESECTION: - Acute inflammation with mucosal ulceration, and extension through inner layer of muscularis propria.  Perforation not identified.  No malignancy. - Acute serositis with reactive serosal proliferation. - 2 benign lymph nodes identified.  Reyansh Kushnir DESCRIPTION:  Specimen: Received fresh is a portion of bowel with a area of firm tissue, consistent with the clinically stated "old anastomosis" Length: The bowel measures 32  cm in length by 1.8 cm in diameter Serosa: Tan, smooth, and intact with a stitch marking "necrotic bowel" Contents: A minimal amount of fecal material Mucosa/Wall: The mucosa is tan and glistening with a 8.0 cm i by 0.3 cm area of tan, firm tissue, consistent with the "old anastomosis".  There is a small are a of indentation at the stitch that encompasses the full circumference of the bowel and measures 0.2 cm in thickness. Lymph nodes: The bowel is palpated and 2 lymph node candidates are identified each measuring 0.2 cm. Block Summary: A1-A2 bowel margins, from opposite ends A3 radial margin closest to "area of necrosis " A4 anastomotic site with 2 lymph node candidates, whole A5 "area of necrosis"  Amparo Bristol, 08/29/2021)   Final Diagnosis performed by Mark Martinique, MD.   Electronically signed 08/30/2021 Technical component performed at [facility], [place] [address] [place], [place] [address].  Professional component performed at [employer]. [facility], [place] [address], [place], [place] [address].  Immunohistochemistry Technical component (if applicable) was performed at [facility]. [address] [place], [place], [place] [address].   IMMUNOHISTOCHEMISTRY DISCLAIMER (if applicable): Some of these immunohistochemical stains may h ave been developed and the performance characteristics determine by [facility]. Some may not have been cleared or approved by the U.S. Food and Drug Administration. The FDA has determined that such clearance or approval is not necessary. This test is used for clinical purposes. It should not be regarded as investigational or for research. This laboratory is certified under the [place] (CLIA-88) as qualified to perform high complexity clinical laboratory testing.  The controls stained appropriately.   CBC     Status: None   Collection Time: 08/29/21  9:38 AM   Result Value Ref Range   WBC 7.8 4.0 - 10.5 K/uL   RBC 4.08 3.87 - 5.11 MIL/uL   Hemoglobin 12.3 12.0 - 15.0 g/dL   HCT 37.7 36.0 - 46.0 %   MCV 92.4 80.0 - 100.0 fL   MCH 30.1 26.0 - 34.0 pg   MCHC 32.6 30.0 - 36.0 g/dL   RDW 13.5 11.5 - 15.5 %   Platelets 233 150 - 400 K/uL   nRBC 0.0 0.0 - 0.2 %    Comment: Performed at [facility], [place] [address] [place], [place] [id number]  Basic metabolic panel     Status: Abnormal   Collection Time: 08/29/21  9:38 AM  Result Value Ref Range   Sodium  140 135 - 145 mmol/L   Potassium 3.4 (L) 3.5 - 5.1 mmol/L   Chloride 106 98 - 111 mmol/L   CO2 27 22 - 32 mmol/L   Glucose, Bld 138 (H) 70 - 99 mg/dL    Comment: Glucose reference range applies only to samples taken after fasting for at least 8 hours.   BUN 14 8 - 23 mg/dL   Creatinine, Ser 1.93 0.44 - 1.00 mg/dL   Calcium 8.1 (L) 8.9 - 10.3 mg/dL   GFR, Estimated >79 >02 mL/min    Comment: (NOTE) Calculated using the CKD-EPI Creatinine Equation (2021)    Anion gap 7 5 - 15    Comment: Performed at [facility], [address] [place], [place] [address]  Basic metabolic panel     Status: Abnormal   Collection Time: 08/29/21 11:51 AM  Result Value Ref Range   Sodium 139 135 - 145 mmol/L   Potassium 3.2 (L) 3.5 - 5.1 mmol/L   Chloride 105 98 - 111 mmol/L   CO2 27 22 - 32 mmol/L   Glucose, Bld 127 (H) 70 - 99 mg/dL    Comment: Glucose reference range applies only to samples taken after fasting for at least 8 hours.   BUN 14 8 - 23 mg/dL   Creatinine, Ser 5.32 0.44 - 1.00 mg/dL   Calcium 8.0 (L) 8.9 - 10.3 mg/dL   GFR, Estimated >99 >24 mL/min    Comment: (NOTE) Calculated using the CKD-EPI Creatinine Equation (2021)    Anion gap 7 5 - 15    Comment: Performed at [facility], [address] [place], [place] [address]  CBC     Status: Abnormal   Collection Time: 08/30/21  5:17 AM  Result Value Ref Range   WBC 9.3 4.0 - 10.5  K/uL   RBC 3.56 (L) 3.87 - 5.11 MIL/uL   Hemoglobin 10.9 (L) 12.0 - 15.0 g/dL   HCT 19.6 (L) 22.2 - 97.9 %   MCV 91.0 80.0 - 100.0 fL   MCH 30.6 26.0 - 34.0 pg   MCHC 33.6 30.0 - 36.0 g/dL   RDW 89.2 11.9 - 41.7 %   Platelets 203 150 - 400 K/uL   nRBC 0.0 0.0 - 0.2 %    Comment: Performed at [facility], [address], [place], [place] [address]  Magnesium     Status: None   Collection Time: 08/30/21  5:17 AM  Result Value Ref Range   Magnesium 1.8 1.7 - 2.4 mg/dL    Comment: Performed at [facility], [address], [place], [place] [address]  Phosphorus     Status: Abnormal   Collection Time: 08/30/21  5:17 AM  Result Value Ref Range   Phosphorus 1.8 (L) 2.5 - 4.6 mg/dL    Comment: Performed at [facility], [address] [place], [place] [address]  Comprehensive metabolic panel     Status: Abnormal   Collection Time: 08/30/21  5:17 AM  Result Value Ref Range   Sodium 136 135 - 145 mmol/L   Potassium 3.3 (L) 3.5 - 5.1 mmol/L   Chloride 104 98 - 111 mmol/L   CO2 26 22 - 32 mmol/L   Glucose, Bld 105 (H) 70 - 99 mg/dL    Comment: Glucose reference range applies only to samples taken after fasting for at least 8 hours.   BUN 13 8 - 23 mg/dL   Creatinine, Ser 7.02 0.44 -  1.00 mg/dL   Calcium 7.9 (L) 8.9 - 10.3 mg/dL   Total Protein 5.0 (L) 6.5 - 8.1 g/dL   Albumin 2.2 (L) 3.5 - 5.0 g/dL   AST 17 15 - 41 U/L   ALT 11 0 - 44 U/L   Alkaline Phosphatase 39 38 - 126 U/L   Total Bilirubin 0.5 0.3 - 1.2 mg/dL   GFR, Estimated >60 >60 mL/min    Comment: (NOTE) Calculated using the CKD-EPI Creatinine Equation (2021)    Anion gap 6 5 - 15    Comment: Performed at [facility], [place] [address], [place], [place] [address]    Imaging / Studies: No results found.  Medications / Allergies: per chart  Antibiotics: Anti-infectives (From admission, onward)    Start     Dose/Rate Route Frequency Ordered  Stop   08/29/21 0600  cefoTEtan (CEFOTAN) 2 g in sodium chloride 0.9 % 100 mL IVPB  Status:  Discontinued        2 g 200 mL/hr over 30 Minutes Intravenous On call to O.R. 08/28/21 1243 08/28/21 1730   08/29/21 0600  ceFAZolin (ANCEF) IVPB 1 g/50 mL premix        1 g 100 mL/hr over 30 Minutes Intravenous Every 8 hours 08/28/21 1815 09/01/21 0559   08/28/21 2200  cefoTEtan (CEFOTAN) 2 g in sodium chloride 0.9 % 100 mL IVPB        2 g 200 mL/hr over 30 Minutes Intravenous Every 12 hours 08/28/21 1815 08/28/21 2127   08/27/21 2200  ceFAZolin (ANCEF) IVPB 1 g/50 mL premix  Status:  Discontinued        1 g 100 mL/hr over 30 Minutes Intravenous Every 8 hours 08/27/21 1331 08/28/21 1815   08/26/21 0200  cefTRIAXone (ROCEPHIN) 1 g in sodium chloride 0.9 % 100 mL IVPB  Status:  Discontinued        1 g 200 mL/hr over 30 Minutes Intravenous Every 24 hours 08/25/21 0324 08/27/21 1331   08/25/21 0115  cefTRIAXone (ROCEPHIN) 1 g in sodium chloride 0.9 % 100 mL IVPB        1 g 200 mL/hr over 30 Minutes Intravenous  Once 08/25/21 0114 08/25/21 [id number]         Note: Portions of this report may have been transcribed using voice recognition software. Every effort was made to ensure accuracy; however, inadvertent computerized transcription errors may be present.   Any transcriptional errors that result from this process are unintentional.    Adin Hector, MD, FACS, MASCRS Esophageal, Gastrointestinal & Colorectal Surgery Robotic and Minimally Invasive Surgery  [place]. [address], [place], [place] [address] [phone] Fax [phone] Main  CONTACT INFORMATION:  Weekday (9AM-5PM): Call [facility] main office at [phone]  Weeknight (5PM-9AM) or Weekend/Holiday: Check [email or web address] (password " [email or web address]") for General Surgery [facility] coverage  (Please, do not use SecureChat as it is not reliable communication to reach operating  surgeons for immediate patient care)      08/30/2021  2:10 PM

## 2021-08-31 ENCOUNTER — Inpatient Hospital Stay (HOSPITAL_COMMUNITY): Payer: Medicare Other

## 2021-08-31 ENCOUNTER — Inpatient Hospital Stay: Payer: Self-pay

## 2021-08-31 DIAGNOSIS — B9620 Unspecified Escherichia coli [E. coli] as the cause of diseases classified elsewhere: Secondary | ICD-10-CM

## 2021-08-31 DIAGNOSIS — I471 Supraventricular tachycardia: Secondary | ICD-10-CM | POA: Diagnosis not present

## 2021-08-31 DIAGNOSIS — I159 Secondary hypertension, unspecified: Secondary | ICD-10-CM | POA: Diagnosis not present

## 2021-08-31 DIAGNOSIS — K56609 Unspecified intestinal obstruction, unspecified as to partial versus complete obstruction: Secondary | ICD-10-CM | POA: Diagnosis not present

## 2021-08-31 DIAGNOSIS — F32A Depression, unspecified: Secondary | ICD-10-CM | POA: Diagnosis not present

## 2021-08-31 LAB — RETICULOCYTES
Immature Retic Fract: 28.4 % — ABNORMAL HIGH (ref 2.3–15.9)
RBC.: 3.29 MIL/uL — ABNORMAL LOW (ref 3.87–5.11)
Retic Count, Absolute: 50.3 10*3/uL (ref 19.0–186.0)
Retic Ct Pct: 1.5 % (ref 0.4–3.1)

## 2021-08-31 LAB — COMPREHENSIVE METABOLIC PANEL
ALT: 10 U/L (ref 0–44)
AST: 19 U/L (ref 15–41)
Albumin: 2.2 g/dL — ABNORMAL LOW (ref 3.5–5.0)
Alkaline Phosphatase: 42 U/L (ref 38–126)
Anion gap: 5 (ref 5–15)
BUN: 12 mg/dL (ref 8–23)
CO2: 27 mmol/L (ref 22–32)
Calcium: 7.7 mg/dL — ABNORMAL LOW (ref 8.9–10.3)
Chloride: 105 mmol/L (ref 98–111)
Creatinine, Ser: 0.7 mg/dL (ref 0.44–1.00)
GFR, Estimated: >60 mL/min (ref >60)
Glucose, Bld: 91 mg/dL (ref 70–99)
Potassium: 3.4 mmol/L — ABNORMAL LOW (ref 3.5–5.1)
Sodium: 137 mmol/L (ref 135–145)
Total Bilirubin: 0.7 mg/dL (ref 0.3–1.2)
Total Protein: 5 g/dL — ABNORMAL LOW (ref 6.5–8.1)

## 2021-08-31 LAB — CBC WITH DIFFERENTIAL/PLATELET
Abs Immature Granulocytes: 0.11 10*3/uL — ABNORMAL HIGH (ref 0.00–0.07)
Basophils Absolute: 0 10*3/uL (ref 0.0–0.1)
Basophils Relative: 0 %
Eosinophils Absolute: 0.3 10*3/uL (ref 0.0–0.5)
Eosinophils Relative: 4 %
HCT: 29.8 % — ABNORMAL LOW (ref 36.0–46.0)
Hemoglobin: 9.9 g/dL — ABNORMAL LOW (ref 12.0–15.0)
Immature Granulocytes: 1 %
Lymphocytes Relative: 12 %
Lymphs Abs: 1.2 10*3/uL (ref 0.7–4.0)
MCH: 30 pg (ref 26.0–34.0)
MCHC: 33.2 g/dL (ref 30.0–36.0)
MCV: 90.3 fL (ref 80.0–100.0)
Monocytes Absolute: 0.5 10*3/uL (ref 0.1–1.0)
Monocytes Relative: 5 %
Neutro Abs: 7.4 10*3/uL (ref 1.7–7.7)
Neutrophils Relative %: 78 %
Platelets: 204 10*3/uL (ref 150–400)
RBC: 3.3 MIL/uL — ABNORMAL LOW (ref 3.87–5.11)
RDW: 13.5 % (ref 11.5–15.5)
WBC: 9.5 10*3/uL (ref 4.0–10.5)
nRBC: 0 % (ref 0.0–0.2)

## 2021-08-31 LAB — VITAMIN B12: Vitamin B-12: 494 pg/mL (ref 180–914)

## 2021-08-31 LAB — IRON AND TIBC
Iron: 15 ug/dL — ABNORMAL LOW (ref 28–170)
Saturation Ratios: 7 % — ABNORMAL LOW (ref 10.4–31.8)
TIBC: 213 ug/dL — ABNORMAL LOW (ref 250–450)
UIBC: 198 ug/dL

## 2021-08-31 LAB — FERRITIN: Ferritin: 154 ng/mL (ref 11–307)

## 2021-08-31 LAB — MAGNESIUM: Magnesium: 2.4 mg/dL (ref 1.7–2.4)

## 2021-08-31 LAB — FOLATE: Folate: 18.2 ng/mL (ref >5.9)

## 2021-08-31 LAB — PHOSPHORUS: Phosphorus: 2.5 mg/dL (ref 2.5–4.6)

## 2021-08-31 MED ORDER — POTASSIUM CHLORIDE 10 MEQ/100ML IV SOLN
10.0000 meq | INTRAVENOUS | Status: AC
  Administered 2021-08-31 (×4): 10 meq via INTRAVENOUS
  Filled 2021-08-31 (×4): qty 100

## 2021-08-31 MED ORDER — ENOXAPARIN SODIUM 30 MG/0.3ML IJ SOSY
30.0000 mg | PREFILLED_SYRINGE | INTRAMUSCULAR | Status: DC
  Administered 2021-08-31 – 2021-09-05 (×6): 30 mg via SUBCUTANEOUS
  Filled 2021-08-31 (×7): qty 0.3

## 2021-08-31 NOTE — Progress Notes (Signed)
At patient bedside to explain PICC and insert.  After explaining to patient, she stated that she wanted to wait until tomorrow when her son is here.  He is currently in route from [place] to [facility] and unavailable by phone.  Discussed with patient that the PICC is for TNA as ordered by MD.  She states that she is "81 years old and wants her son here before agreeing to it.  Spoke with Press photographer and secure chat sent to [place], [place] and doctor.
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
## 2021-08-31 NOTE — Progress Notes (Signed)
Mobility Specialist - Progress Note    08/31/21 0946  Mobility  HOB Elevated/Bed Position Self regulated  Activity Ambulated with assistance in hallway  Range of Motion/Exercises Active  Level of Assistance Minimal assist, patient does 75% or more  Assistive Device Front wheel walker  Distance Ambulated (ft) 150 ft  Activity Response Tolerated well  Transport method Ambulatory  $Mobility charge 1 Mobility   Pt received in bed and agreeable to mobility. C/o stomach pain during ambulation. Pt to bed after session with all needs met.    [facility]

## 2021-08-31 NOTE — Progress Notes (Signed)
PHARMACY - TOTAL PARENTERAL NUTRITION CONSULT NOTE   Indication:  Bowel obstruction  Patient Measurements: Height: 5' (152.4 cm) Weight: 56.7 kg (125 lb) IBW/kg (Calculated) : 45.5 TPN AdjBW (KG): 56.7 Body mass index is 24.41 kg/m. Usual Weight:   Assessment: Pt is an 57 yoF admitted on 8/18 with abdominal pain. CT revealed SBO. Initially treated with conservative management, but required ex lap with lysis of adhesions, small bowel resection, and primary incisional hernia repair on 8/21. Pharmacy consulted to manage TPN as pt is POD#3 with ileus.   Glucose / Insulin: No hx DM. FBG 91 -Goal CBG <150 Electrolytes: K slightly low; all other lytes WNL including CorrCa 9.1 -Goal K >4, Mg > 2 per CCS Renal: SCr and BUN WNL  Hepatic: LFTs, Alk Phos, T.bili WNL. Alb low Intake / Output; MIVF: Strict I/O not measured. -UOP x6 unmeasured occurrences, NG output: 1200 mL, no stool GI Imaging: GI Surgeries / Procedures:  -8/21: ex lap, lysis of adhesions, small bowel resection, primary incisional hernia repair  Central access: PICC ordered TPN start date: 8/25 pending PICC placement  Nutritional Goals: Goal TPN rate is ___ mL/hr (provides ___ g of protein and ___ kcals per day)  RD Assessment: Pending    Current Nutrition:   Plan:  -K replaced by MD -TPN consult received after the noon deadline, no PICC yet. -Dietician consulted, labs ordered for tomorrow morning. -TPN anticipated to start on 8/25 @ [place], PharmD 08/31/2021,12:18 PM

## 2021-09-01 ENCOUNTER — Encounter (HOSPITAL_COMMUNITY): Payer: Self-pay | Admitting: Internal Medicine

## 2021-09-01 DIAGNOSIS — K567 Ileus, unspecified: Secondary | ICD-10-CM

## 2021-09-01 DIAGNOSIS — F32A Depression, unspecified: Secondary | ICD-10-CM | POA: Diagnosis not present

## 2021-09-01 DIAGNOSIS — I471 Supraventricular tachycardia: Secondary | ICD-10-CM | POA: Diagnosis not present

## 2021-09-01 DIAGNOSIS — I159 Secondary hypertension, unspecified: Secondary | ICD-10-CM | POA: Diagnosis not present

## 2021-09-01 DIAGNOSIS — K56609 Unspecified intestinal obstruction, unspecified as to partial versus complete obstruction: Secondary | ICD-10-CM | POA: Diagnosis not present

## 2021-09-01 LAB — COMPREHENSIVE METABOLIC PANEL
ALT: 11 U/L (ref 0–44)
AST: 23 U/L (ref 15–41)
Albumin: 2.4 g/dL — ABNORMAL LOW (ref 3.5–5.0)
Alkaline Phosphatase: 47 U/L (ref 38–126)
Anion gap: 7 (ref 5–15)
BUN: 13 mg/dL (ref 8–23)
CO2: 24 mmol/L (ref 22–32)
Calcium: 7.9 mg/dL — ABNORMAL LOW (ref 8.9–10.3)
Chloride: 106 mmol/L (ref 98–111)
Creatinine, Ser: 0.62 mg/dL (ref 0.44–1.00)
GFR, Estimated: >60 mL/min (ref >60)
Glucose, Bld: 71 mg/dL (ref 70–99)
Potassium: 3.4 mmol/L — ABNORMAL LOW (ref 3.5–5.1)
Sodium: 137 mmol/L (ref 135–145)
Total Bilirubin: 1 mg/dL (ref 0.3–1.2)
Total Protein: 5.3 g/dL — ABNORMAL LOW (ref 6.5–8.1)

## 2021-09-01 LAB — CBC WITH DIFFERENTIAL/PLATELET
Abs Immature Granulocytes: 0.14 10*3/uL — ABNORMAL HIGH (ref 0.00–0.07)
Basophils Absolute: 0.1 10*3/uL (ref 0.0–0.1)
Basophils Relative: 1 %
Eosinophils Absolute: 0.4 10*3/uL (ref 0.0–0.5)
Eosinophils Relative: 5 %
HCT: 30.5 % — ABNORMAL LOW (ref 36.0–46.0)
Hemoglobin: 10 g/dL — ABNORMAL LOW (ref 12.0–15.0)
Immature Granulocytes: 2 %
Lymphocytes Relative: 14 %
Lymphs Abs: 1.1 10*3/uL (ref 0.7–4.0)
MCH: 29.9 pg (ref 26.0–34.0)
MCHC: 32.8 g/dL (ref 30.0–36.0)
MCV: 91 fL (ref 80.0–100.0)
Monocytes Absolute: 0.7 10*3/uL (ref 0.1–1.0)
Monocytes Relative: 8 %
Neutro Abs: 5.6 10*3/uL (ref 1.7–7.7)
Neutrophils Relative %: 70 %
Platelets: 224 10*3/uL (ref 150–400)
RBC: 3.35 MIL/uL — ABNORMAL LOW (ref 3.87–5.11)
RDW: 13.3 % (ref 11.5–15.5)
WBC: 7.9 10*3/uL (ref 4.0–10.5)
nRBC: 0 % (ref 0.0–0.2)

## 2021-09-01 LAB — TRIGLYCERIDES: Triglycerides: 85 mg/dL (ref <150)

## 2021-09-01 LAB — PHOSPHORUS: Phosphorus: 2.8 mg/dL (ref 2.5–4.6)

## 2021-09-01 LAB — MAGNESIUM: Magnesium: 2.2 mg/dL (ref 1.7–2.4)

## 2021-09-01 MED ORDER — ENSURE ENLIVE PO LIQD
237.0000 mL | Freq: Two times a day (BID) | ORAL | Status: DC
Start: 2021-09-01 — End: 2021-09-05
  Administered 2021-09-01 – 2021-09-05 (×9): 237 mL via ORAL

## 2021-09-01 MED ORDER — POTASSIUM CHLORIDE 10 MEQ/100ML IV SOLN
10.0000 meq | INTRAVENOUS | Status: AC
  Administered 2021-09-01 (×4): 10 meq via INTRAVENOUS
  Filled 2021-09-01 (×5): qty 100

## 2021-09-01 MED ORDER — SODIUM CHLORIDE 0.9 % IV BOLUS
500.0000 mL | Freq: Once | INTRAVENOUS | Status: AC
  Administered 2021-09-01: 500 mL via INTRAVENOUS
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
## 2021-09-01 NOTE — Progress Notes (Signed)
PHYSICAL THERAPY  Attempted to see x 2 this afternoon "just not a good day for her".  Pt c/o fatigue with poor sleep.  I am here Saturday and will see her in am.  Per secure chat, family has concerns about pts ability to navigate stairs.  Felecia Shelling  PTA Acute  Rehabilitation Services Office M-F          [phone] Weekend pager [phone]

## 2021-09-01 NOTE — Progress Notes (Signed)
Per Darl Pikes RN, patient is refusing PICC placement at this time. Please re-order when agreeable. Thank you.

## 2021-09-01 NOTE — Progress Notes (Signed)
Pharmacy: TPN consult K 3.4 after 4 runs yesterday Pt refused PICC placement yesterday & today Multiple stools charted in Epic. D/w [facility] PA-C: hopeful to advance diet today, DC TPN consult.   Plan:  5 runs Kcl ordered DC TPN consult & labs If pt unable to tolerate diet CCS will re-order TPN consult F/u w/ RD for goals  Herby Abraham, Pharm.D 09/01/2021 11:20 AM

## 2021-09-01 NOTE — Progress Notes (Signed)
Mobility Specialist Cancellation/Refusal Note:    09/01/21 0958  Mobility  Activity Refused mobility     Reason for Cancellation/Refusal: Pt declined mobility at this time. Pt feeling weak, wanting to ambulate after drinking some broth. Will check back as schedule permits.      [facility]
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
## 2021-09-02 DIAGNOSIS — I159 Secondary hypertension, unspecified: Secondary | ICD-10-CM | POA: Diagnosis not present

## 2021-09-02 DIAGNOSIS — F32A Depression, unspecified: Secondary | ICD-10-CM | POA: Diagnosis not present

## 2021-09-02 DIAGNOSIS — I471 Supraventricular tachycardia: Secondary | ICD-10-CM | POA: Diagnosis not present

## 2021-09-02 DIAGNOSIS — K56609 Unspecified intestinal obstruction, unspecified as to partial versus complete obstruction: Secondary | ICD-10-CM | POA: Diagnosis not present

## 2021-09-02 LAB — CBC WITH DIFFERENTIAL/PLATELET
Abs Immature Granulocytes: 0.31 10*3/uL — ABNORMAL HIGH (ref 0.00–0.07)
Basophils Absolute: 0.1 10*3/uL (ref 0.0–0.1)
Basophils Relative: 1 %
Eosinophils Absolute: 0.4 10*3/uL (ref 0.0–0.5)
Eosinophils Relative: 5 %
HCT: 29.4 % — ABNORMAL LOW (ref 36.0–46.0)
Hemoglobin: 9.8 g/dL — ABNORMAL LOW (ref 12.0–15.0)
Immature Granulocytes: 4 %
Lymphocytes Relative: 18 %
Lymphs Abs: 1.5 10*3/uL (ref 0.7–4.0)
MCH: 30.3 pg (ref 26.0–34.0)
MCHC: 33.3 g/dL (ref 30.0–36.0)
MCV: 91 fL (ref 80.0–100.0)
Monocytes Absolute: 0.8 10*3/uL (ref 0.1–1.0)
Monocytes Relative: 10 %
Neutro Abs: 5 10*3/uL (ref 1.7–7.7)
Neutrophils Relative %: 62 %
Platelets: 254 10*3/uL (ref 150–400)
RBC: 3.23 MIL/uL — ABNORMAL LOW (ref 3.87–5.11)
RDW: 13.4 % (ref 11.5–15.5)
WBC: 8.1 10*3/uL (ref 4.0–10.5)
nRBC: 0 % (ref 0.0–0.2)

## 2021-09-02 LAB — COMPREHENSIVE METABOLIC PANEL
ALT: <5 U/L (ref 0–44)
AST: 15 U/L (ref 15–41)
Albumin: 2.6 g/dL — ABNORMAL LOW (ref 3.5–5.0)
Alkaline Phosphatase: 27 U/L — ABNORMAL LOW (ref 38–126)
Anion gap: 5 (ref 5–15)
BUN: <5 mg/dL — ABNORMAL LOW (ref 8–23)
CO2: 26 mmol/L (ref 22–32)
Calcium: 8.1 mg/dL — ABNORMAL LOW (ref 8.9–10.3)
Chloride: 105 mmol/L (ref 98–111)
Creatinine, Ser: <0.3 mg/dL — ABNORMAL LOW (ref 0.44–1.00)
Glucose, Bld: 91 mg/dL (ref 70–99)
Potassium: 3.4 mmol/L — ABNORMAL LOW (ref 3.5–5.1)
Sodium: 136 mmol/L (ref 135–145)
Total Bilirubin: 0.1 mg/dL — ABNORMAL LOW (ref 0.3–1.2)
Total Protein: 4.8 g/dL — ABNORMAL LOW (ref 6.5–8.1)

## 2021-09-02 LAB — PHOSPHORUS: Phosphorus: 1.4 mg/dL — ABNORMAL LOW (ref 2.5–4.6)

## 2021-09-02 LAB — MAGNESIUM: Magnesium: 2.4 mg/dL (ref 1.7–2.4)

## 2021-09-02 MED ORDER — POTASSIUM CHLORIDE 10 MEQ/100ML IV SOLN
10.0000 meq | INTRAVENOUS | Status: AC
  Administered 2021-09-02 (×3): 10 meq via INTRAVENOUS
  Filled 2021-09-02 (×3): qty 100

## 2021-09-02 MED ORDER — POTASSIUM PHOSPHATES 15 MMOLE/5ML IV SOLN
30.0000 mmol | Freq: Once | INTRAVENOUS | Status: AC
  Administered 2021-09-02: 30 mmol via INTRAVENOUS
  Filled 2021-09-02: qty 10
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
## 2021-09-02 NOTE — Progress Notes (Signed)
5 Days Post-Op   Subjective/Chief Complaint: Having bms and flatus, taking clears, ng has been clamped for some time   Objective: Vital signs in last 24 hours: Temp:  [97.6 F (36.4 C)-98.9 F (37.2 C)] 97.9 F (36.6 C) (08/26 0523) Pulse Rate:  [67-75] 67 (08/26 0523) Resp:  [18-20] 18 (08/26 0523) BP: (146-159)/(67-73) 159/70 (08/26 0523) SpO2:  [95 %-97 %] 97 % (08/26 0523) Last BM Date : 09/01/21  Intake/Output from previous day: 08/25 0701 - 08/26 0700 In: 1326.4 [P.O.:240; I.V.:200; IV Piggyback:886.4] Out: 450 [Emesis/NG output:450] Intake/Output this shift: No intake/output data recorded.  Ab soft approp tender incisoin clean   Lab Results:  Recent Labs    09/01/21 0459 09/02/21 0535  WBC 7.9 8.1  HGB 10.0* 9.8*  HCT 30.5* 29.4*  PLT 224 254   BMET Recent Labs    09/01/21 0459 09/02/21 0535  NA 137 136  K 3.4* 3.4*  CL 106 105  CO2 24 26  GLUCOSE 71 91  BUN 13 <5*  CREATININE 0.62 <0.30*  CALCIUM 7.9* 8.1*   PT/INR No results for input(s): "LABPROT", "INR" in the last 72 hours. ABG No results for input(s): "PHART", "HCO3" in the last 72 hours.  Invalid input(s): "PCO2", "PO2"  Studies/Results: [place] EKG SITE RITE  Result Date: 08/31/2021 If Site Rite image not attached, placement could not be confirmed due to current cardiac rhythm.   Anti-infectives: Anti-infectives (From admission, onward)    Start     Dose/Rate Route Frequency Ordered Stop   08/29/21 0600  cefoTEtan (CEFOTAN) 2 g in sodium chloride 0.9 % 100 mL IVPB  Status:  Discontinued        2 g 200 mL/hr over 30 Minutes Intravenous On call to O.R. 08/28/21 1243 08/28/21 1730   08/29/21 0600  ceFAZolin (ANCEF) IVPB 1 g/50 mL premix        1 g 100 mL/hr over 30 Minutes Intravenous Every 8 hours 08/28/21 1815 08/31/21 2236   08/28/21 2200  cefoTEtan (CEFOTAN) 2 g in sodium chloride 0.9 % 100 mL IVPB        2 g 200 mL/hr over 30 Minutes Intravenous Every 12 hours 08/28/21 1815  08/28/21 2127   08/27/21 2200  ceFAZolin (ANCEF) IVPB 1 g/50 mL premix  Status:  Discontinued        1 g 100 mL/hr over 30 Minutes Intravenous Every 8 hours 08/27/21 1331 08/28/21 1815   08/26/21 0200  cefTRIAXone (ROCEPHIN) 1 g in sodium chloride 0.9 % 100 mL IVPB  Status:  Discontinued        1 g 200 mL/hr over 30 Minutes Intravenous Every 24 hours 08/25/21 0324 08/27/21 1331   08/25/21 0115  cefTRIAXone (ROCEPHIN) 1 g in sodium chloride 0.9 % 100 mL IVPB        1 g 200 mL/hr over 30 Minutes Intravenous  Once 08/25/21 0114 08/25/21 0333       Assessment/Plan: POD 5 s/p LOA and SBR 8/21 Dr. Michaell Cowing - dc ng, adat to fulls -no need for picc and tpn - continue to mobilize for bowel function.  PT/OT   FEN: clears ID: [email or web address] for UTI completed VTE: lovenox ordered   Per primary: UTI HTN Junctional Tachycardia  Rebecca Hurley 09/02/2021

## 2021-09-02 NOTE — Progress Notes (Signed)
PT Cancellation Note  Patient Details Name: KYMBERLYN ECKFORD MRN: [id number] DOB: 1940/05/07   Cancelled Treatment:     attempted to see x 2 today.  Pt stating, "I just can't walk today" with MAX c/o fatigue then later c/o pain.  "They pulled that tube out and I couldn't stop coughing" causing ABD pain recent surgery.  Pt not progressing as well as expected with increased hospital length of stay.  Pt was seen by OT this morning and now rec SNF.  Will continue to follow and attempt to see another day.   Felecia Shelling  PTA Acute  Rehabilitation Services Office M-F          [phone] Weekend pager [phone]

## 2021-09-03 ENCOUNTER — Encounter (HOSPITAL_COMMUNITY): Payer: Self-pay | Admitting: Internal Medicine

## 2021-09-03 DIAGNOSIS — I471 Supraventricular tachycardia: Secondary | ICD-10-CM | POA: Diagnosis not present

## 2021-09-03 DIAGNOSIS — K56609 Unspecified intestinal obstruction, unspecified as to partial versus complete obstruction: Secondary | ICD-10-CM | POA: Diagnosis not present

## 2021-09-03 DIAGNOSIS — I159 Secondary hypertension, unspecified: Secondary | ICD-10-CM | POA: Diagnosis not present

## 2021-09-03 DIAGNOSIS — F32A Depression, unspecified: Secondary | ICD-10-CM | POA: Diagnosis not present

## 2021-09-03 LAB — COMPREHENSIVE METABOLIC PANEL
ALT: 22 U/L (ref 0–44)
AST: 30 U/L (ref 15–41)
Albumin: 2.4 g/dL — ABNORMAL LOW (ref 3.5–5.0)
Alkaline Phosphatase: 55 U/L (ref 38–126)
Anion gap: 8 (ref 5–15)
BUN: 7 mg/dL — ABNORMAL LOW (ref 8–23)
CO2: 26 mmol/L (ref 22–32)
Calcium: 8.4 mg/dL — ABNORMAL LOW (ref 8.9–10.3)
Chloride: 101 mmol/L (ref 98–111)
Creatinine, Ser: 0.6 mg/dL (ref 0.44–1.00)
GFR, Estimated: >60 mL/min (ref >60)
Glucose, Bld: 90 mg/dL (ref 70–99)
Potassium: 3.6 mmol/L (ref 3.5–5.1)
Sodium: 135 mmol/L (ref 135–145)
Total Bilirubin: 0.7 mg/dL (ref 0.3–1.2)
Total Protein: 5.5 g/dL — ABNORMAL LOW (ref 6.5–8.1)

## 2021-09-03 LAB — CBC WITH DIFFERENTIAL/PLATELET
Abs Immature Granulocytes: 0.39 10*3/uL — ABNORMAL HIGH (ref 0.00–0.07)
Basophils Absolute: 0.1 10*3/uL (ref 0.0–0.1)
Basophils Relative: 1 %
Eosinophils Absolute: 0.4 10*3/uL (ref 0.0–0.5)
Eosinophils Relative: 4 %
HCT: 29.1 % — ABNORMAL LOW (ref 36.0–46.0)
Hemoglobin: 9.8 g/dL — ABNORMAL LOW (ref 12.0–15.0)
Immature Granulocytes: 4 %
Lymphocytes Relative: 18 %
Lymphs Abs: 1.6 10*3/uL (ref 0.7–4.0)
MCH: 30.3 pg (ref 26.0–34.0)
MCHC: 33.7 g/dL (ref 30.0–36.0)
MCV: 90.1 fL (ref 80.0–100.0)
Monocytes Absolute: 0.8 10*3/uL (ref 0.1–1.0)
Monocytes Relative: 9 %
Neutro Abs: 5.7 10*3/uL (ref 1.7–7.7)
Neutrophils Relative %: 64 %
Platelets: 292 10*3/uL (ref 150–400)
RBC: 3.23 MIL/uL — ABNORMAL LOW (ref 3.87–5.11)
RDW: 13.3 % (ref 11.5–15.5)
WBC: 8.8 10*3/uL (ref 4.0–10.5)
nRBC: 0 % (ref 0.0–0.2)

## 2021-09-03 LAB — PHOSPHORUS: Phosphorus: 3.8 mg/dL (ref 2.5–4.6)

## 2021-09-03 LAB — MAGNESIUM: Magnesium: 1.8 mg/dL (ref 1.7–2.4)

## 2021-09-03 MED ORDER — POTASSIUM CHLORIDE CRYS ER 20 MEQ PO TBCR
40.0000 meq | EXTENDED_RELEASE_TABLET | Freq: Two times a day (BID) | ORAL | Status: AC
Start: 2021-09-03 — End: 2021-09-03
  Administered 2021-09-03 (×2): 40 meq via ORAL
  Filled 2021-09-03 (×2): qty 2

## 2021-09-03 MED ORDER — MAGNESIUM SULFATE 2 GM/50ML IV SOLN
2.0000 g | Freq: Once | INTRAVENOUS | Status: AC
  Administered 2021-09-03: 2 g via INTRAVENOUS
  Filled 2021-09-03: qty 50

## 2021-09-03 MED ORDER — HYDROMORPHONE HCL 1 MG/ML IJ SOLN
0.5000 mg | INTRAMUSCULAR | Status: DC | PRN
  Administered 2021-09-03: 1 mg via INTRAVENOUS
  Administered 2021-09-04: 0.5 mg via INTRAVENOUS
  Administered 2021-09-04: 1 mg via INTRAVENOUS
  Filled 2021-09-03 (×3): qty 1
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
## 2021-09-03 NOTE — Progress Notes (Signed)
6 Days Post-Op   Subjective/Chief Complaint: Having loose stools, tol liquids, oob   Objective: Vital signs in last 24 hours: Temp:  [97.8 F (36.6 C)-98.5 F (36.9 C)] 98.5 F (36.9 C) (08/27 0434) Pulse Rate:  [58-72] 58 (08/27 0640) Resp:  [18] 18 (08/27 0434) BP: (155-170)/(63-80) 155/63 (08/27 0640) SpO2:  [96 %-100 %] 96 % (08/27 0434) Last BM Date : 09/02/21  Intake/Output from previous day: 08/26 0701 - 08/27 0700 In: 678.2 [IV Piggyback:678.2] Out: -  Intake/Output this shift: No intake/output data recorded.  Ab soft approp tender incision clean  Lab Results:  Recent Labs    09/02/21 0535 09/03/21 0507  WBC 8.1 8.8  HGB 9.8* 9.8*  HCT 29.4* 29.1*  PLT 254 292   BMET Recent Labs    09/02/21 0535 09/03/21 0507  NA 136 135  K 3.4* 3.6  CL 105 101  CO2 26 26  GLUCOSE 91 90  BUN <5* 7*  CREATININE <0.30* 0.60  CALCIUM 8.1* 8.4*   PT/INR No results for input(s): "LABPROT", "INR" in the last 72 hours. ABG No results for input(s): "PHART", "HCO3" in the last 72 hours.  Invalid input(s): "PCO2", "PO2"  Studies/Results: No results found.  Anti-infectives: Anti-infectives (From admission, onward)    Start     Dose/Rate Route Frequency Ordered Stop   08/29/21 0600  cefoTEtan (CEFOTAN) 2 g in sodium chloride 0.9 % 100 mL IVPB  Status:  Discontinued        2 g 200 mL/hr over 30 Minutes Intravenous On call to O.R. 08/28/21 1243 08/28/21 1730   08/29/21 0600  ceFAZolin (ANCEF) IVPB 1 g/50 mL premix        1 g 100 mL/hr over 30 Minutes Intravenous Every 8 hours 08/28/21 1815 08/31/21 2236   08/28/21 2200  cefoTEtan (CEFOTAN) 2 g in sodium chloride 0.9 % 100 mL IVPB        2 g 200 mL/hr over 30 Minutes Intravenous Every 12 hours 08/28/21 1815 08/28/21 2127   08/27/21 2200  ceFAZolin (ANCEF) IVPB 1 g/50 mL premix  Status:  Discontinued        1 g 100 mL/hr over 30 Minutes Intravenous Every 8 hours 08/27/21 1331 08/28/21 1815   08/26/21 0200   cefTRIAXone (ROCEPHIN) 1 g in sodium chloride 0.9 % 100 mL IVPB  Status:  Discontinued        1 g 200 mL/hr over 30 Minutes Intravenous Every 24 hours 08/25/21 0324 08/27/21 1331   08/25/21 0115  cefTRIAXone (ROCEPHIN) 1 g in sodium chloride 0.9 % 100 mL IVPB        1 g 200 mL/hr over 30 Minutes Intravenous  Once 08/25/21 0114 08/25/21 0333       Assessment/Plan: POD 6 s/p LOA and SBR 8/21 Dr. Michaell Cowing - soft diet -wants to go to [facility] which sounds reasonable given lack of help if tolerates diet can even go tomorrow from our standpoint - continue to mobilize for bowel function.  PT/OT   FEN: soft diet ID: ancef for UTI completed VTE: lovenox ordered   Per primary: UTI HTN Junctional Tachycardia   Rebecca Hurley 09/03/2021

## 2021-09-04 ENCOUNTER — Encounter (HOSPITAL_COMMUNITY): Payer: Self-pay | Admitting: Internal Medicine

## 2021-09-04 ENCOUNTER — Inpatient Hospital Stay (HOSPITAL_COMMUNITY): Payer: Medicare Other

## 2021-09-04 DIAGNOSIS — I159 Secondary hypertension, unspecified: Secondary | ICD-10-CM | POA: Diagnosis not present

## 2021-09-04 DIAGNOSIS — I471 Supraventricular tachycardia: Secondary | ICD-10-CM | POA: Diagnosis not present

## 2021-09-04 DIAGNOSIS — M7989 Other specified soft tissue disorders: Secondary | ICD-10-CM | POA: Diagnosis not present

## 2021-09-04 DIAGNOSIS — F32A Depression, unspecified: Secondary | ICD-10-CM | POA: Diagnosis not present

## 2021-09-04 DIAGNOSIS — K56609 Unspecified intestinal obstruction, unspecified as to partial versus complete obstruction: Secondary | ICD-10-CM | POA: Diagnosis not present

## 2021-09-04 LAB — CBC WITH DIFFERENTIAL/PLATELET
Abs Immature Granulocytes: 0.38 10*3/uL — ABNORMAL HIGH (ref 0.00–0.07)
Basophils Absolute: 0.1 10*3/uL (ref 0.0–0.1)
Basophils Relative: 1 %
Eosinophils Absolute: 0.2 10*3/uL (ref 0.0–0.5)
Eosinophils Relative: 2 %
HCT: 29.6 % — ABNORMAL LOW (ref 36.0–46.0)
Hemoglobin: 9.9 g/dL — ABNORMAL LOW (ref 12.0–15.0)
Immature Granulocytes: 4 %
Lymphocytes Relative: 14 %
Lymphs Abs: 1.4 10*3/uL (ref 0.7–4.0)
MCH: 29.8 pg (ref 26.0–34.0)
MCHC: 33.4 g/dL (ref 30.0–36.0)
MCV: 89.2 fL (ref 80.0–100.0)
Monocytes Absolute: 0.9 10*3/uL (ref 0.1–1.0)
Monocytes Relative: 9 %
Neutro Abs: 7 10*3/uL (ref 1.7–7.7)
Neutrophils Relative %: 70 %
Platelets: 316 10*3/uL (ref 150–400)
RBC: 3.32 MIL/uL — ABNORMAL LOW (ref 3.87–5.11)
RDW: 13.4 % (ref 11.5–15.5)
WBC: 10 10*3/uL (ref 4.0–10.5)
nRBC: 0 % (ref 0.0–0.2)

## 2021-09-04 LAB — COMPREHENSIVE METABOLIC PANEL
ALT: 22 U/L (ref 0–44)
AST: 27 U/L (ref 15–41)
Albumin: 2.3 g/dL — ABNORMAL LOW (ref 3.5–5.0)
Alkaline Phosphatase: 59 U/L (ref 38–126)
Anion gap: 8 (ref 5–15)
BUN: 7 mg/dL — ABNORMAL LOW (ref 8–23)
CO2: 25 mmol/L (ref 22–32)
Calcium: 8 mg/dL — ABNORMAL LOW (ref 8.9–10.3)
Chloride: 100 mmol/L (ref 98–111)
Creatinine, Ser: 0.52 mg/dL (ref 0.44–1.00)
GFR, Estimated: >60 mL/min (ref >60)
Glucose, Bld: 99 mg/dL (ref 70–99)
Potassium: 4.2 mmol/L (ref 3.5–5.1)
Sodium: 133 mmol/L — ABNORMAL LOW (ref 135–145)
Total Bilirubin: 0.6 mg/dL (ref 0.3–1.2)
Total Protein: 5.4 g/dL — ABNORMAL LOW (ref 6.5–8.1)

## 2021-09-04 LAB — MAGNESIUM: Magnesium: 2.1 mg/dL (ref 1.7–2.4)

## 2021-09-04 LAB — PHOSPHORUS: Phosphorus: 2.9 mg/dL (ref 2.5–4.6)

## 2021-09-04 MED ORDER — SODIUM CHLORIDE 0.9 % IV SOLN: INTRAVENOUS | Status: DC

## 2021-09-04 MED ORDER — IBUPROFEN 400 MG PO TABS
400.0000 mg | ORAL_TABLET | Freq: Four times a day (QID) | ORAL | Status: DC | PRN
Start: 2021-09-04 — End: 2021-09-05
  Administered 2021-09-04 – 2021-09-05 (×3): 400 mg via ORAL
  Filled 2021-09-04 (×3): qty 1

## 2021-09-04 MED ORDER — OXYCODONE HCL 5 MG PO TABS
5.0000 mg | ORAL_TABLET | Freq: Four times a day (QID) | ORAL | Status: DC | PRN
  Administered 2021-09-04: 5 mg via ORAL
  Filled 2021-09-04: qty 1

## 2021-09-04 NOTE — Progress Notes (Signed)
13[address]ch[address]arria s/p Lap lysis of adhesions, hernia repair.  said this morning that just above the right calf is hurting, like cramping.  diff walking on it.  Unable to tell if it's really swollen, but it feels warm to touch.

## 2021-09-04 NOTE — Care Management Important Message (Signed)
Important Message  Patient Details IM Letter given to the Patient. Name: Rebecca Hurley MRN: [id number] Date of Birth: 1940/03/01   Medicare Important Message Given:  Yes     Caren Macadam 09/04/2021, 2:55 PM
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
## 2021-09-04 NOTE — Progress Notes (Signed)
Bilateral lower extremity venous duplex has been completed. Preliminary results can be found in CV Proc through chart review.   09/04/21 10:08 AM Olen Cordial RVT

## 2021-09-04 NOTE — TOC Progression Note (Addendum)
Transition of Care [facility]) - Progression Note   Patient Details  Name: Rebecca Hurley MRN: [id number] Date of Birth: 29-May-1940  Transition of [facility]) CM/SW Contact  Ewing Schlein, LCSW Phone Number: 09/04/2021, 3:14 PM  Clinical Narrative: PT and OT evaluations are now recommending SNF. Patient and son are requesting [facility]. CSW received call from [facility] in admissions at [facility] and confirmed she has spoken with the son and can make a bed offer pending an FL2. FL2 done; [facility] confirmed. [facility] has made a bed offer.  CSW started insurance authorization for SNF in [facility] portal (patient will show up under Alona Bene as this is her first name). Reference ID # is: [id number]. Insurance authorization is pending. CSW updated patient's son regarding the process as patient was sleeping in the room. Son aware PT evaluation indicates patient can be transported via private vehicle. TOC awaiting insurance approval.  Addendum: CSW received insurance approval for 09/05/2021-09/07/2021 for SNF. Tracey in admissions updated.  Expected Discharge Plan: Home w Home Health Services Barriers to Discharge: Continued Medical Work up  Expected Discharge Plan and Services Expected Discharge Plan: Home w Home Health Services In-house Referral: Clinical Social Work Post Acute Care Choice: Home Health, Durable Medical Equipment Living arrangements for the past 2 months: Single Family Home HH Arranged: PT, OT HH Agency: [facility] Date [facility] Agency Contacted: 08/31/21 Time HH Agency Contacted: [phone] Representative spoke with at [facility] Agency: Becky Sax  Readmission Risk Interventions     No data to display
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
## 2021-09-04 NOTE — Progress Notes (Signed)
Mobility Specialist Cancellation/Refusal Note:    09/04/21 0454  Mobility  Activity Refused mobility     Reason for Cancellation/Refusal: Pt declined mobility at this time. Pt wanted to wait for the doctor to come in. Requested afternoon ambulation. Will check back as schedule permits.     [facility]

## 2021-09-05 DIAGNOSIS — K56609 Unspecified intestinal obstruction, unspecified as to partial versus complete obstruction: Secondary | ICD-10-CM | POA: Diagnosis not present

## 2021-09-05 DIAGNOSIS — I159 Secondary hypertension, unspecified: Secondary | ICD-10-CM | POA: Diagnosis not present

## 2021-09-05 DIAGNOSIS — N390 Urinary tract infection, site not specified: Secondary | ICD-10-CM | POA: Diagnosis not present

## 2021-09-05 DIAGNOSIS — F32A Depression, unspecified: Secondary | ICD-10-CM | POA: Diagnosis not present

## 2021-09-05 LAB — CBC WITH DIFFERENTIAL/PLATELET
Abs Immature Granulocytes: 0.28 10*3/uL — ABNORMAL HIGH (ref 0.00–0.07)
Basophils Absolute: 0.1 10*3/uL (ref 0.0–0.1)
Basophils Relative: 1 %
Eosinophils Absolute: 0.2 10*3/uL (ref 0.0–0.5)
Eosinophils Relative: 3 %
HCT: 27.5 % — ABNORMAL LOW (ref 36.0–46.0)
Hemoglobin: 9.1 g/dL — ABNORMAL LOW (ref 12.0–15.0)
Immature Granulocytes: 3 %
Lymphocytes Relative: 16 %
Lymphs Abs: 1.3 10*3/uL (ref 0.7–4.0)
MCH: 29.8 pg (ref 26.0–34.0)
MCHC: 33.1 g/dL (ref 30.0–36.0)
MCV: 90.2 fL (ref 80.0–100.0)
Monocytes Absolute: 0.7 10*3/uL (ref 0.1–1.0)
Monocytes Relative: 9 %
Neutro Abs: 5.6 10*3/uL (ref 1.7–7.7)
Neutrophils Relative %: 68 %
Platelets: 346 10*3/uL (ref 150–400)
RBC: 3.05 MIL/uL — ABNORMAL LOW (ref 3.87–5.11)
RDW: 13.6 % (ref 11.5–15.5)
WBC: 8.1 10*3/uL (ref 4.0–10.5)
nRBC: 0 % (ref 0.0–0.2)

## 2021-09-05 LAB — COMPREHENSIVE METABOLIC PANEL
ALT: 25 U/L (ref 0–44)
AST: 28 U/L (ref 15–41)
Albumin: 2.2 g/dL — ABNORMAL LOW (ref 3.5–5.0)
Alkaline Phosphatase: 55 U/L (ref 38–126)
Anion gap: 4 — ABNORMAL LOW (ref 5–15)
BUN: 8 mg/dL (ref 8–23)
CO2: 26 mmol/L (ref 22–32)
Calcium: 8.3 mg/dL — ABNORMAL LOW (ref 8.9–10.3)
Chloride: 107 mmol/L (ref 98–111)
Creatinine, Ser: 0.48 mg/dL (ref 0.44–1.00)
GFR, Estimated: >60 mL/min (ref >60)
Glucose, Bld: 109 mg/dL — ABNORMAL HIGH (ref 70–99)
Potassium: 3.7 mmol/L (ref 3.5–5.1)
Sodium: 137 mmol/L (ref 135–145)
Total Bilirubin: 0.6 mg/dL (ref 0.3–1.2)
Total Protein: 5.2 g/dL — ABNORMAL LOW (ref 6.5–8.1)

## 2021-09-05 LAB — PHOSPHORUS: Phosphorus: 3.2 mg/dL (ref 2.5–4.6)

## 2021-09-05 LAB — MAGNESIUM: Magnesium: 2 mg/dL (ref 1.7–2.4)

## 2021-09-05 MED ORDER — ENSURE ENLIVE PO LIQD: 237.0000 mL | Freq: Two times a day (BID) | ORAL | 12 refills | Status: DC

## 2021-09-05 MED ORDER — ONDANSETRON HCL 4 MG/2ML IJ SOLN: 4.0000 mg | Freq: Four times a day (QID) | INTRAMUSCULAR | 0 refills | Status: DC | PRN

## 2021-09-05 MED ORDER — LIP MEDEX EX OINT: TOPICAL_OINTMENT | Freq: Two times a day (BID) | CUTANEOUS | 0 refills | Status: DC

## 2021-09-05 MED ORDER — SIMETHICONE 40 MG/0.6ML PO SUSP: 80.0000 mg | Freq: Four times a day (QID) | ORAL | 0 refills | Status: DC | PRN

## 2021-09-05 MED ORDER — IBUPROFEN 400 MG PO TABS: 400.0000 mg | ORAL_TABLET | Freq: Four times a day (QID) | ORAL | 0 refills | Status: AC | PRN

## 2021-09-05 MED ORDER — TRAMADOL HCL 50 MG PO TABS: 50.0000 mg | ORAL_TABLET | Freq: Four times a day (QID) | ORAL | 0 refills | Status: AC | PRN

## 2021-09-05 NOTE — Discharge Planning (Signed)
Report was called to nurse at [facility].  All questions answered.  Discharge summary placed with pt's chart for facility

## 2021-09-05 NOTE — Progress Notes (Signed)
Mobility Specialist - Progress Note   09/05/21 1450  Mobility  HOB Elevated/Bed Position Self regulated  Activity Ambulated with assistance in hallway  Range of Motion/Exercises Active  Level of Assistance Minimal assist, patient does 75% or more  Assistive Device Front wheel walker  Distance Ambulated (ft) 120 ft  Activity Response Tolerated well  Transport method Ambulatory  $Mobility charge 1 Mobility   Pt received in bed and agreeable to mobility to bathroom & hallway.  Pt to bed after session with all needs met.    [facility]

## 2021-09-05 NOTE — TOC Transition Note (Signed)
Transition of Care [facility]) - CM/SW Discharge Note  Patient Details  Name: Rebecca Hurley MRN: [id number] Date of Birth: 11/12/1940  [facility] CM/SW Contact:  Ewing Schlein, LCSW Phone Number: 09/05/2021, 1:20 PM  Clinical Narrative: Patient is medically stable for discharge to SNF today. Patient will go to room 209 at [facility] and the number for report is [phone]. Discharge summary, discharge orders, and SNF transfer report faxed to facility in hub. Son no longer able to transport patient. Medical necessity form done; PTAR scheduled. Discharge packet completed. RN updated. [facility] notified patient will discharge to SNF rather than going home with [facility]. TOC signing off.  Final next level of care: Skilled Nursing Facility Barriers to Discharge: Barriers Resolved  Patient Goals and CMS Choice Patient states their goals for this hospitalization and ongoing recovery are:: Go to [facility] CMS Medicare.gov Compare Post Acute Care list provided to:: Patient Choice offered to / list presented to : Patient, Adult Children  Discharge Placement Existing PASRR number confirmed : 09/04/21          Patient chooses bed at: [facility] Patient to be transferred to facility by: PTAR Name of family member notified: Theophilus Kinds (son) Patient and family notified of of transfer: 09/05/21  Discharge Plan and Services In-house Referral: Clinical Social Work Post Acute Care Choice: Home Health, Durable Medical Equipment          DME Arranged: N/A DME Agency: NA HH Arranged: PT, OT HH Agency: [facility] Date HH Agency Contacted: 08/31/21 Time HH Agency Contacted: 1442 Representative spoke with at [facility] Agency: Becky Sax  Readmission Risk Interventions     No data to display
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
## 2021-09-05 NOTE — Discharge Summary (Signed)
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 estimation, is 65 to 70%. The left ventricle has hyperdynamic function. The left ventricle has no regional wall motion abnormalities. There is mild left ventricular hypertrophy. Left ventricular diastolic parameters are consistent with Grade I diastolic dysfunction (impaired relaxation).  2. Right ventricular systolic function is normal. The right ventricular size is normal. Tricuspid regurgitation signal is inadequate for assessing PA pressure.  3. The mitral valve is normal in structure. No evidence of mitral valve regurgitation. No evidence of mitral stenosis.  4. The aortic valve is tricuspid. There is mild calcification of the aortic valve. Aortic valve regurgitation is not visualized. No aortic stenosis is present.  5. The inferior vena  cava is normal in size with greater than 50% respiratory variability, suggesting right atrial pressure of 3 mmHg. FINDINGS  Left Ventricle: Left ventricular ejection fraction, by estimation, is 65 to 70%. The left ventricle has hyperdynamic function. The left ventricle has no regional wall motion abnormalities. The left ventricular internal cavity size was normal in size. There is mild left ventricular hypertrophy. Left ventricular diastolic parameters are consistent with Grade I diastolic dysfunction (impaired relaxation). Right Ventricle: The right ventricular size is normal. No increase in right ventricular wall thickness. Right ventricular systolic function is normal. Tricuspid regurgitation signal is inadequate for assessing PA pressure. Left Atrium: Left atrial size was normal in size. Right Atrium: Right atrial size was normal in size. Pericardium: There is no evidence of pericardial effusion. Mitral Valve: The mitral valve is normal in structure. No evidence of mitral valve regurgitation. No evidence of mitral valve stenosis. Tricuspid Valve: The tricuspid valve is normal in structure. Tricuspid valve regurgitation is trivial. Aortic Valve: The aortic valve is tricuspid. There is mild calcification of the aortic valve. Aortic valve regurgitation is not visualized. No aortic stenosis is present. Pulmonic Valve: The pulmonic valve was normal in structure. Pulmonic valve regurgitation is not visualized. Aorta: The aortic root is normal in size and structure. Venous: The inferior vena cava is normal in size with greater than 50% respiratory variability, suggesting right atrial pressure of 3 mmHg. IAS/Shunts: No atrial level shunt detected by color flow Doppler.  LEFT VENTRICLE PLAX 2D LVIDd:         2.80 cm   Diastology LVIDs:         1.60 cm   LV e' medial:    5.00 cm/s LV PW:         1.10 cm   LV E/e' medial:  14.8 LV IVS:        1.00 cm   LV e' lateral:   6.64 cm/s LVOT diam:     2.00 cm   LV E/e' lateral:  11.2 LV SV:         74 LV SV Index:   49 LVOT Area:     3.14 cm  RIGHT VENTRICLE RV S prime:     11.50 cm/s TAPSE (M-mode): 2.3 cm LEFT ATRIUM             Index        RIGHT ATRIUM          Index LA diam:        3.10 cm 2.05 cm/m   RA Area:     9.33 cm LA Vol (A2C):   41.2 ml 27.23 ml/m  RA Volume:   17.80 ml 11.77 ml/m LA Vol (A4C):   41.3 ml 27.30 ml/m LA  Biplane Vol: 43.2 ml 28.55 ml/m  AORTIC VALVE LVOT Vmax:   119.00 cm/s LVOT Vmean:  87.600 cm/s LVOT VTI:    0.237 m  AORTA Ao Root diam: 2.70 cm Ao Asc diam:  2.90 cm MITRAL VALVE MV Area (PHT): 2.74 cm    SHUNTS MV Decel Time: 277 msec    Systemic VTI:  0.24 m MV E velocity: 74.10 cm/s  Systemic Diam: 2.00 cm MV A velocity: 83.60 cm/s MV E/A ratio:  0.89 Dalton McleanMD Electronically signed by Franki Monte Signature Date/Time: 08/11/2021/3:17:33 PM    Final    DG Chest Portable 1 View  Result Date: 08/10/2021 CLINICAL DATA:  Chest discomfort EXAM: PORTABLE CHEST 1 VIEW COMPARISON:  Chest x-ray 10/16/2018 FINDINGS: The heart size and mediastinal contours are within normal limits. Both lungs are clear. The visualized skeletal structures are unremarkable. IMPRESSION: No active disease. Electronically Signed   By: Ronney Asters M.D.   On: 08/10/2021 19:38    Microbiology: Results for orders placed or performed during the hospital encounter of 08/24/21  Urine Culture     Status: Abnormal   Collection Time: 08/25/21 12:13 AM   Specimen: Urine, Clean Catch  Result Value Ref Range Status   Specimen Description   Final    URINE, CLEAN CATCH Performed at [facility], [place] [address] [place], [place] [address]    Special Requests   Final    NONE Performed at [facility], [place] [address], [place], [place] [address]    Culture >=100,000 COLONIES/mL ESCHERICHIA COLI (A)  Final   Report Status 08/27/2021 FINAL  Final   Organism ID, Bacteria ESCHERICHIA COLI (A)  Final      Susceptibility   Escherichia  coli - MIC*    AMPICILLIN >=32 RESISTANT Resistant     CEFAZOLIN <=4 SENSITIVE Sensitive     CEFEPIME <=0.12 SENSITIVE Sensitive     CEFTRIAXONE <=0.25 SENSITIVE Sensitive     CIPROFLOXACIN <=0.25 SENSITIVE Sensitive     GENTAMICIN >=16 RESISTANT Resistant     IMIPENEM <=0.25 SENSITIVE Sensitive     NITROFURANTOIN <=16 SENSITIVE Sensitive     TRIMETH/SULFA >=320 RESISTANT Resistant     AMPICILLIN/SULBACTAM 16 INTERMEDIATE Intermediate     PIP/TAZO <=4 SENSITIVE Sensitive     * >=100,000 COLONIES/mL ESCHERICHIA COLI  Surgical PCR screen     Status: None   Collection Time: 08/28/21  5:26 AM   Specimen: Nasal Mucosa; Nasal Swab  Result Value Ref Range Status   MRSA, PCR NEGATIVE NEGATIVE Final   Staphylococcus aureus NEGATIVE NEGATIVE Final    Comment: (NOTE) The Xpert SA Assay (FDA approved for NASAL specimens in patients 49 years of age and older), is one component of a comprehensive surveillance program. It is not intended to diagnose infection nor to guide or monitor treatment. Performed at [facility], [place] [address], [place], [place] [address]    Labs: CBC: Recent Labs  Lab 09/01/21 0459 09/02/21 0535 09/03/21 0507 09/04/21 0423 09/05/21 0500  WBC 7.9 8.1 8.8 10.0 8.1  NEUTROABS 5.6 5.0 5.7 7.0 5.6  HGB 10.0* 9.8* 9.8* 9.9* 9.1*  HCT 30.5* 29.4* 29.1* 29.6* 27.5*  MCV 91.0 91.0 90.1 89.2 90.2  PLT 224 254 292 316 [id number]   Basic Metabolic Panel: Recent Labs  Lab 09/01/21 0459 09/02/21 0535 09/03/21 0507 09/04/21 0423 09/05/21 0500  NA 137 136 135 133* 137  K 3.4* 3.4* 3.6 4.2 3.7  CL 106 105 101 100 107  CO2 24 26 26 25 26   GLUCOSE 71 91 90 99 109*  BUN 13 <5* 7* 7* 8  CREATININE 0.62 <0.30* 0.60 0.52 0.48  CALCIUM 7.9* 8.1* 8.4* 8.0* 8.3*  MG 2.2 2.4 1.8 2.1 2.0  PHOS 2.8 1.4* 3.8 2.9 3.2   Liver Function Tests: Recent Labs  Lab 09/01/21 0459 09/02/21 0535 09/03/21 0507 09/04/21 0423 09/05/21 0500  AST 23 15 30 27 28   ALT 11  5 22 22 25   ALKPHOS 47 27* 55 59 55  BILITOT 1.0 0.1* 0.7 0.6 0.6  PROT 5.3* 4.8* 5.5* 5.4* 5.2*  ALBUMIN 2.4* 2.6* 2.4* 2.3* 2.2*   CBG: No results for input(s): "GLUCAP" in the last 168 hours.  Discharge time spent: greater than 30 minutes.  Signed: Raiford Noble, DO [facility] 09/05/2021

## 2021-09-05 NOTE — Progress Notes (Signed)
Mobility Specialist - Progress Note   09/05/21 1208  Mobility  HOB Elevated/Bed Position Self regulated  Activity Ambulated with assistance in hallway;Ambulated with assistance to bathroom  Range of Motion/Exercises Active  Level of Assistance Minimal assist, patient does 75% or more  Assistive Device Front wheel walker  Distance Ambulated (ft) 120 ft  Activity Response Tolerated well  Transport method Ambulatory  $Mobility charge 1 Mobility   Pt received in bed and agreeable to mobility after some motivation. C/o stomach pain & knee pain during ambulation. Assisted pt to restroom for BM. Pt to bed after session with all needs met.    [facility]

## 2021-10-15 IMAGING — CT CT CHEST HIGH RESOLUTION W/O CM
2 of 8 series · 14 of 36 positions shown, 17 images · non-contrast
Comparison: None.

CLINICAL DATA: Evaluate for ILD

EXAM:
CT CHEST WITHOUT CONTRAST
TECHNIQUE: Multidetector CT imaging of the chest was performed following the
standard protocol without intravenous contrast. High resolution
imaging of the lungs, as well as inspiratory and expiratory imaging,
was performed.

[Series 5: high resolution · axial · 0.54mm/px · z∈[-292,-46]mm · 11 of 296 slices shown, 14 images]
[im 25/296  mediastinal]
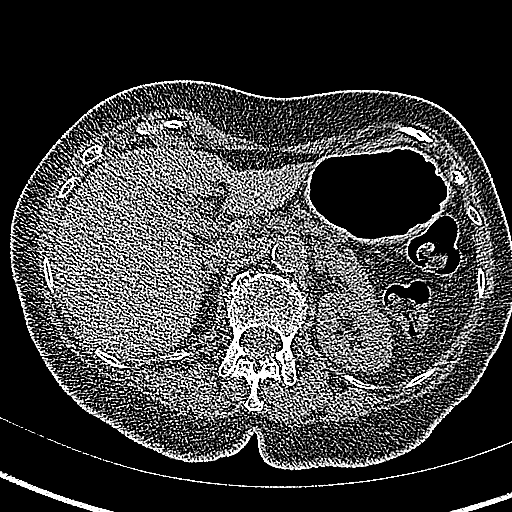
[im 25/296  lung]
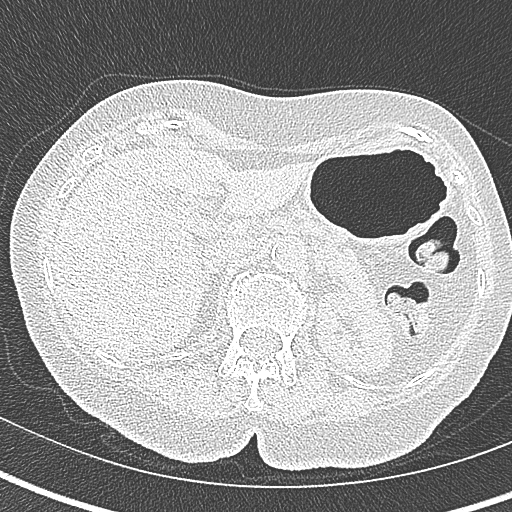
[im 50/296  lung]
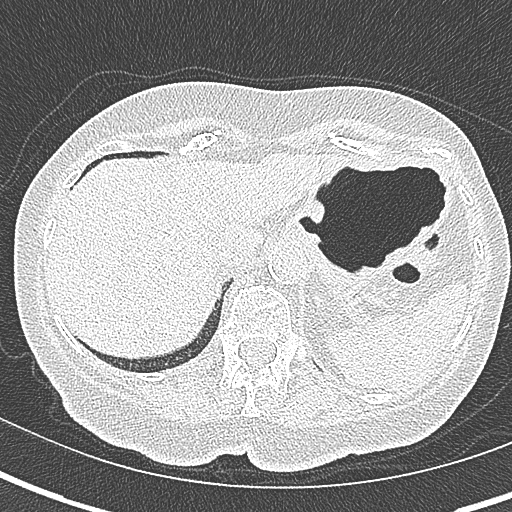
[im 74/296  lung]
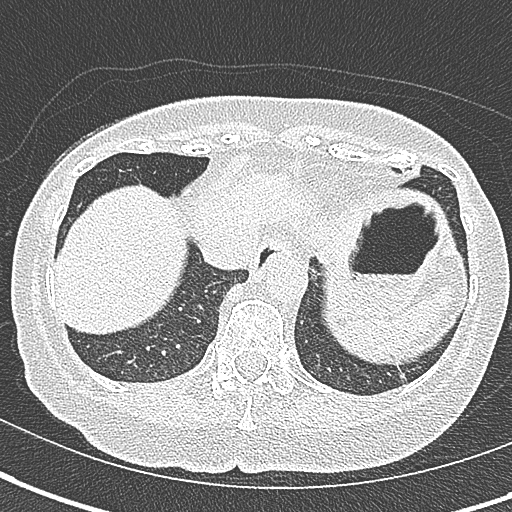
[im 99/296  lung]
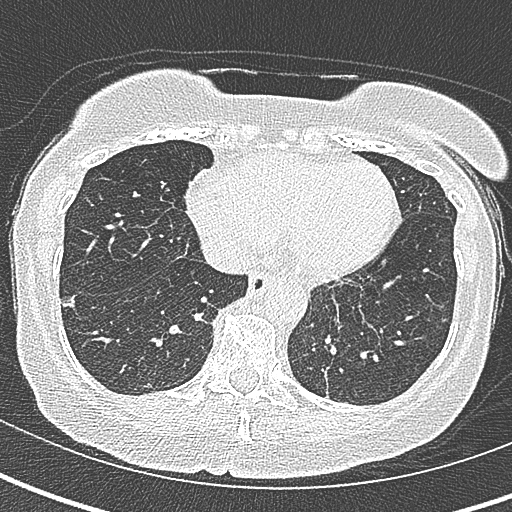
[im 123/296  mediastinal]
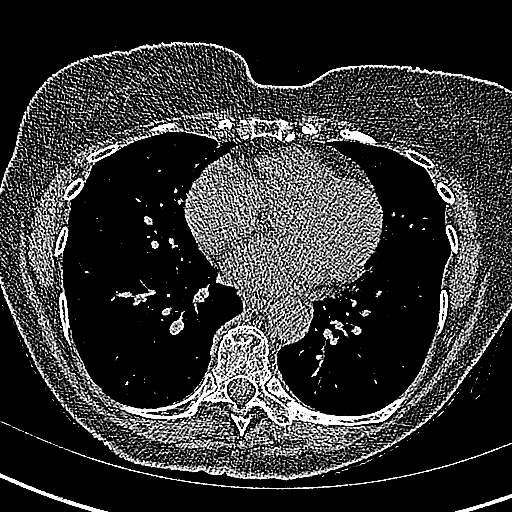
[im 123/296  lung]
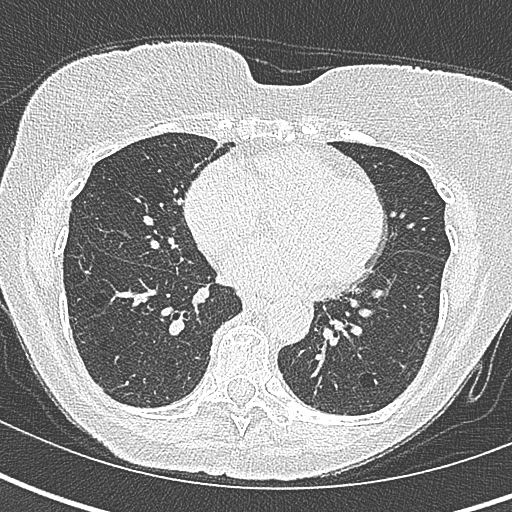
[im 148/296  lung]
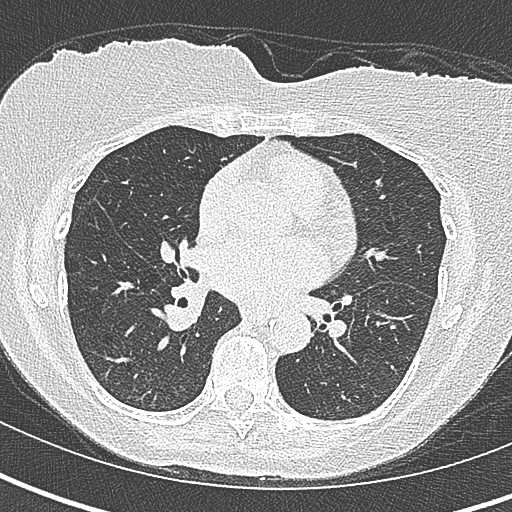
[im 173/296  lung]
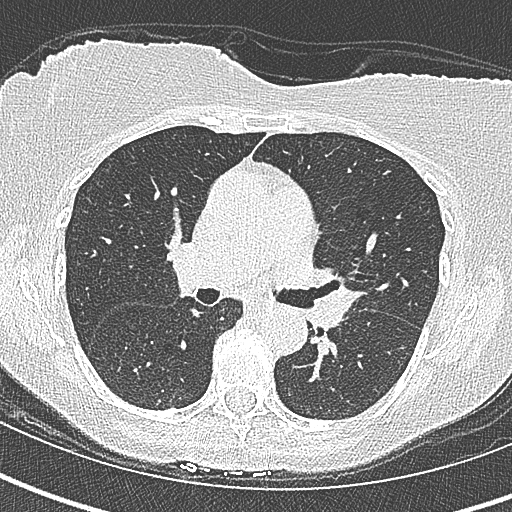
[im 197/296  lung]
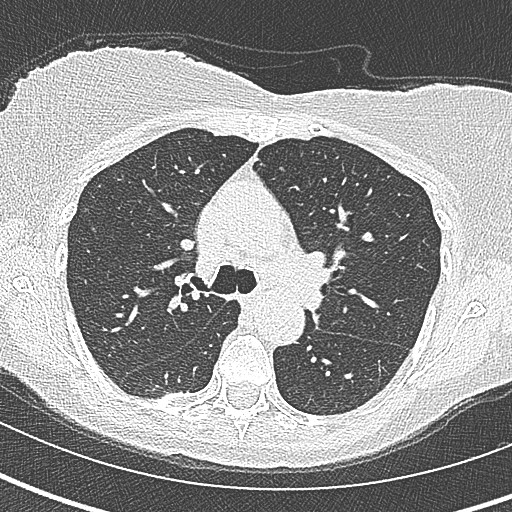
[im 222/296  mediastinal]
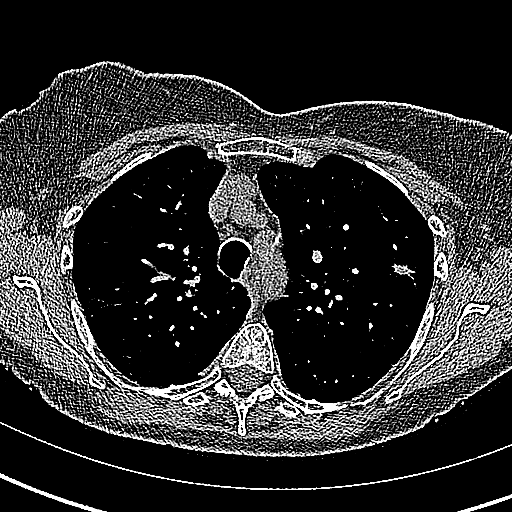
[im 222/296  lung]
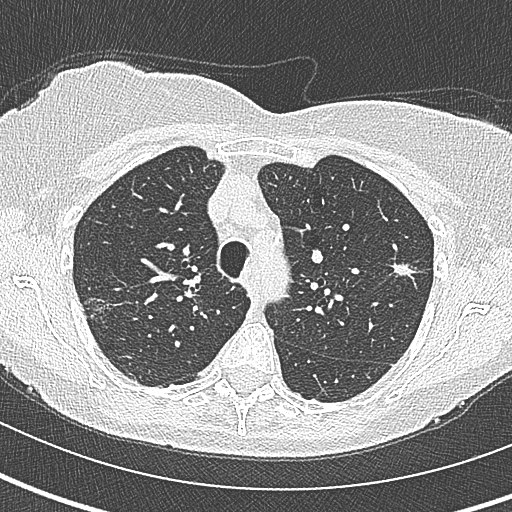
[im 246/296  lung]
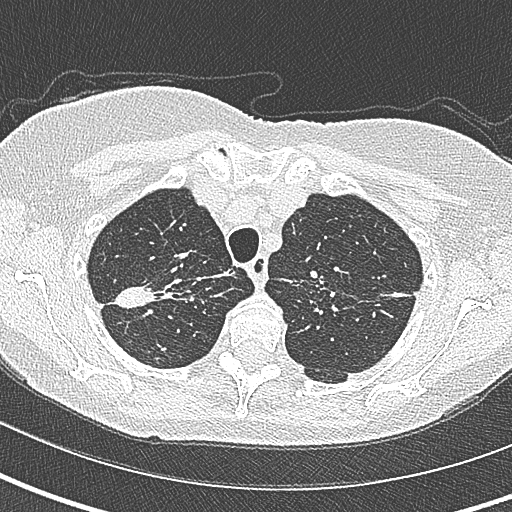
[im 271/296  lung]
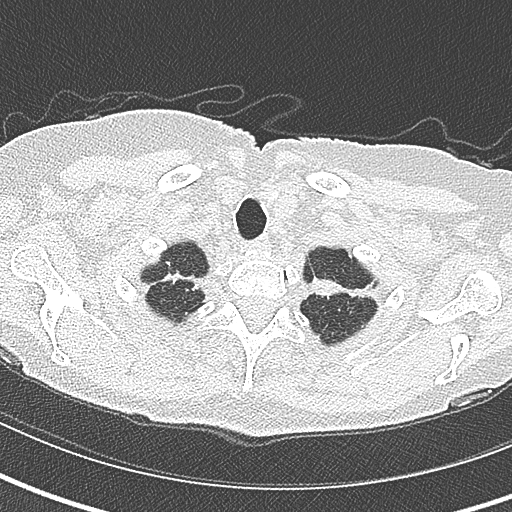

[Series 7: coronal · coronal · 0.54mm/px · 3 of 103 slices shown]
[im 21/103  lung]
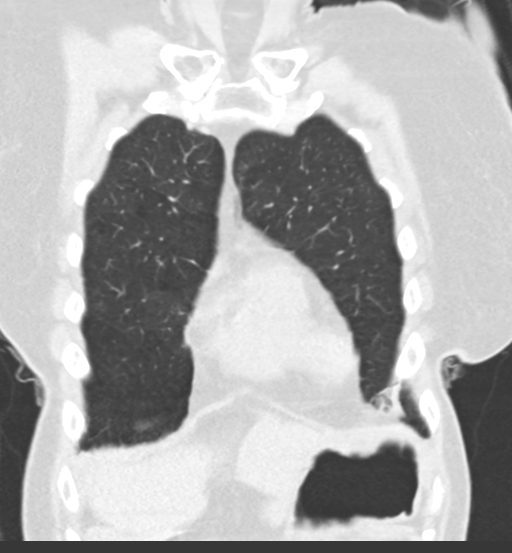
[im 41/103  lung]
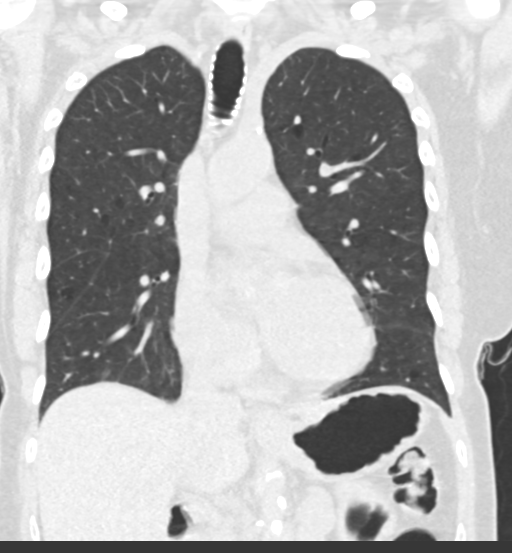
[im 62/103  lung]
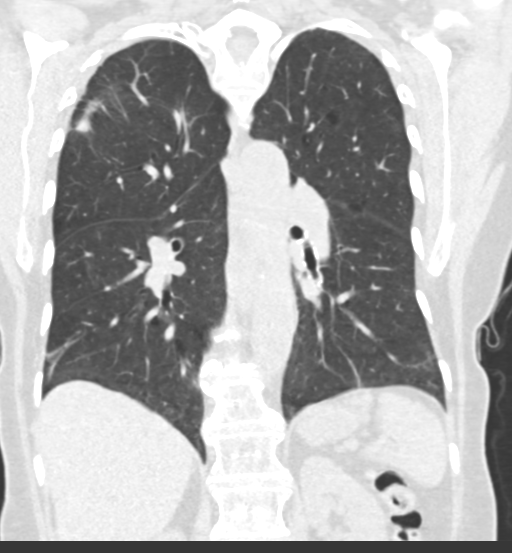

[14 of 36 positions shown; findings below may reference images not displayed]

FINDINGS: Cardiovascular: Aortic atherosclerosis. Normal heart size. No
pericardial effusion.

Mediastinum/Nodes: No enlarged mediastinal, hilar, or axillary lymph
nodes. Thyroid gland, trachea, and esophagus demonstrate no
significant findings.

Lungs/Pleura: There is bandlike consolidation of the bilateral lung
apices with faint internal calcification (series 5, image 34). There
is a spiculated nodule of the peripheral left upper lobe which is
inferior to, and may be a component of previously described bandlike
consolidation, measuring 1.0 x 0.6 cm (series 5, image 75, series 7,
image 49). Lobular air trapping on expiratory phase imaging.
Additional bandlike scarring of the bilateral lung bases. No pleural
effusion or pneumothorax.

Upper Abdomen: No acute abnormality.

Musculoskeletal: No chest wall mass or suspicious bone lesions
identified.
IMPRESSION: 1. There is bandlike consolidation of the bilateral lung apices with
faint internal calcification, suggestive of massive pulmonary
fibrosis seen in chronic pneumoconioses or perhaps an unusual
presentation of pulmonary sarcoidosis. Correlate with inhalational
and occupational exposures. Unusually prominent biapical
pleuroparenchymal scarring related to nonspecific infection or
inflammation is a differential consideration as well. If
characterized by ATS pulmonary fibrosis criteria, these findings are
in an "alternative diagnosis" pattern. Findings are suggestive of an
alternative diagnosis (not UIP) per consensus guidelines: Diagnosis
of Idiopathic Pulmonary Fibrosis: An Official ATS/ERS/JRS/ALAT
Clinical Practice Guideline. Am J Respir Crit Care Med Vol 198, [place]ma
5, [id number]-[id number][DATE].
2. There is a spiculated nodule of the peripheral left upper lobe
which is inferior to, and may be a component of previously described
bandlike consolidation, measuring 1.0 x 0.6 cm. This is of uncertain
nature and may reflect a component of previously described bandlike
consolidation, although is concerning for malignancy given distinct
spiculation. Consider PET-CT to evaluate for metabolic activity.
3. Lobular air trapping on expiratory phase imaging, suggestive of
small airways disease.

Aortic Atherosclerosis ([id number].F).

## 2021-11-07 DIAGNOSIS — M47812 Spondylosis without myelopathy or radiculopathy, cervical region: Secondary | ICD-10-CM | POA: Insufficient documentation

## 2022-02-26 DIAGNOSIS — M7918 Myalgia, other site: Secondary | ICD-10-CM | POA: Insufficient documentation

## 2022-03-05 DIAGNOSIS — M47812 Spondylosis without myelopathy or radiculopathy, cervical region: Secondary | ICD-10-CM | POA: Insufficient documentation

## 2022-03-05 DIAGNOSIS — H814 Vertigo of central origin: Secondary | ICD-10-CM | POA: Insufficient documentation

## 2022-03-06 ENCOUNTER — Telehealth: Payer: Self-pay

## 2022-03-22 ENCOUNTER — Encounter: Payer: Self-pay | Admitting: Neurology

## 2022-03-22 ENCOUNTER — Telehealth: Payer: Self-pay | Admitting: Neurology

## 2022-03-22 ENCOUNTER — Ambulatory Visit: Payer: Medicare Other | Admitting: Neurology

## 2022-03-22 ENCOUNTER — Ambulatory Visit: Payer: Self-pay | Admitting: Neurology

## 2022-03-22 VITALS — BP 180/83 | HR 79 | Ht 60.0 in | Wt 130.0 lb

## 2022-03-22 DIAGNOSIS — H905 Unspecified sensorineural hearing loss: Secondary | ICD-10-CM

## 2022-03-22 DIAGNOSIS — H814 Vertigo of central origin: Secondary | ICD-10-CM | POA: Diagnosis not present

## 2022-03-22 MED ORDER — LORAZEPAM 0.5 MG PO TABS: 0.5000 mg | ORAL_TABLET | Freq: Three times a day (TID) | ORAL | 0 refills | Status: AC
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
## 2022-03-22 NOTE — Telephone Encounter (Signed)
[employer] medicare NPR sent to GI [phone] 

## 2022-03-22 NOTE — Patient Instructions (Addendum)
Lorazepam Tablets What is this medication? LORAZEPAM (lor A ze pam) treats vertigo and also anxiety. It works by Child psychotherapist system slow down. It belongs to a group of medications called benzodiazepines. This medicine may be used for other purposes; ask your health care provider or pharmacist if you have questions. COMMON BRAND NAME(S): Ativan What should I tell my care team before I take this medication? They need to know if you have any of these conditions: Glaucoma Kidney disease Liver disease Lung or breathing disease, such as asthma or COPD Mental health condition Myasthenia gravis Sleep apnea Substance use disorder Suicidal thoughts, plans, or attempt by you or a family member An unusual or allergic reaction to lorazepam, other medications, foods, dyes, or preservatives Pregnant or trying to get pregnant Breast-feeding How should I use this medication? Take this medication by mouth with water. Take it as directed on the prescription label at the same time every day. Keep taking it unless your care team tells you to stop. A special MedGuide will be given to you by the pharmacist with each prescription and refill. Be sure to read this information carefully each time. Talk to your care team about the use of this medication in children. While this medication may be used in children as young as 12 years for selected conditions, precautions do apply. Overdosage: If you think you have taken too much of this medicine contact a poison control center or emergency room at once. NOTE: This medicine is only for you. Do not share this medicine with others. What if I miss a dose? If you miss a dose, take it as soon as you can. If it is almost time for your next dose, take only that dose. Do not take double or extra doses. What may interact with this medication? Do not take this medication with any of the following: Calcium, Magnesium, Potassium, Sodium oxybates Sodium  oxybate This medication may also interact with the following: Alcohol Certain antihistamines Certain medications for depression, such as amitriptyline or trazodone Certain medications for seizures, such as phenobarbital or primidone Divalproex sodium Medications that cause drowsiness before a procedure, such as propofol Medications that help you fall asleep Medications that relax muscles MAOIs, such as Marplan, Nardil, and Parnate Opioids for pain or cough Phenothiazines, such as chlorpromazine, prochlorperazine, thioridazine Probenecid Valproate Valproic acid This list may not describe all possible interactions. Give your health care provider a list of all the medicines, herbs, non-prescription drugs, or dietary supplements you use. Also tell them if you smoke, drink alcohol, or use illegal drugs. Some items may interact with your medicine. What should I watch for while using this medication? Visit your care team for regular checks on your progress. Tell your care team if your symptoms do not start to get better or if they get worse. This medication may affect your coordination, reaction time, or judgment. Do not drive or operate machinery until you know how this medication affects you. Sit up or stand slowly to reduce the risk of dizzy or fainting spells. Drinking alcohol with this medication can increase the risk of these side effects. If you take other medications that also cause drowsiness such as other opioid pain medications, benzodiazepines, or other medications for sleep, you may have more side effects. Give your care team a list of all medications you use. They will tell you how much medication to take. Do not take more medication than directed. Call emergency services if you have problems  breathing or are unusually tired or sleepy. If you or your family notice any changes in your behavior, such as new or worsening depression, thoughts of harming yourself, anxiety, other unusual or  disturbing thoughts, or memory loss, call your care team right away. This medication has a risk of abuse and dependence. Your care team will check you for this while you take this medication. Long term use of this medication may cause your brain and body to depend on it. This can happen even when used as directed by your care team. You and your care team will work together to determine how long you will need to take this medication. If your care team wants you to stop this medication, the dose will be slowly lowered over time to reduce the risk of side effects. Talk to your care team if you wish to become pregnant or think you might be pregnant. Talk to your care team before breastfeeding. Changes to your treatment plan may be needed. What side effects may I notice from receiving this medication? Side effects that you should report to your care team as soon as possible: Allergic reactions--skin rash, itching, hives, swelling of the face, lips, tongue, or throat CNS depression--slow or shallow breathing, shortness of breath, feeling faint, dizziness, confusion, difficulty staying awake Thoughts of suicide or self-harm, worsening mood, feelings of depression Side effects that usually do not require medical attention (report to your care team if they continue or are bothersome): Dizziness Drowsiness Headache Nausea Vomiting This list may not describe all possible side effects. Call your doctor for medical advice about side effects. You may report side effects to FDA at 1-800-FDA-1088. Where should I keep my medication? Keep out of the reach of children. This medication can be abused. Keep your medication in a safe place to protect it from theft. Do not share this medication with anyone. Selling or giving away this medication is dangerous and against the law. Store at room temperature between 20 and 25 degrees C (68 and 77 degrees F). Protect from light. Keep container tightly closed. This medication  may cause accidental overdose and death if taken by other adults, children, or pets. Mix any unused medication with a substance like cat litter or coffee grounds. Then throw the medication away in a sealed container like a sealed bag or a coffee can with a lid. Do not use the medication after the expiration date. NOTE: This sheet is a summary. It may not cover all possible information. If you have questions about this medicine, talk to your doctor, pharmacist, or health care provider.  2023 Elsevier/Gold Standard (2021-05-31 00:00:00) How to Perform the Epley Maneuver The Epley maneuver is an exercise that relieves symptoms of vertigo. Vertigo is the feeling that you or your surroundings are moving when they are not. When you feel vertigo, you may feel like the room is spinning and may have trouble walking. The Epley maneuver is used for a type of vertigo caused by a calcium deposit in a part of the inner ear. The maneuver involves changing head positions to help the deposit move out of the area. You can do this maneuver at home whenever you have symptoms of vertigo. You can repeat it in 24 hours if your vertigo has not gone away. Even though the Epley maneuver may relieve your vertigo for a few weeks, it is possible that your symptoms will return. This maneuver relieves vertigo, but it does not relieve dizziness. What are the risks? If it is  done correctly, the Epley maneuver is considered safe. Sometimes it can lead to dizziness or nausea that goes away after a short time. If you develop other symptoms--such as changes in vision, weakness, or numbness--stop doing the maneuver and call your health care provider. Supplies needed: A bed or table. A pillow. How to do the Epley maneuver     Sit on the edge of a bed or table with your back straight and your legs extended or hanging over the edge of the bed or table. Turn your head halfway toward the affected ear or side as told by your health care  provider. Lie backward quickly with your head turned until you are lying flat on your back. Your head should dangle (head-hanging position). You may want to position a pillow under your shoulders. Hold this position for at least 30 seconds. If you feel dizzy or have symptoms of vertigo, continue to hold the position until the symptoms stop. Turn your head to the opposite direction until your unaffected ear is facing down. Your head should continue to dangle. Hold this position for at least 30 seconds. If you feel dizzy or have symptoms of vertigo, continue to hold the position until the symptoms stop. Turn your whole body to the same side as your head so that you are positioned on your side. Your head will now be nearly facedown and no longer needs to dangle. Hold for at least 30 seconds. If you feel dizzy or have symptoms of vertigo, continue to hold the position until the symptoms stop. Sit back up. You can repeat the maneuver in 24 hours if your vertigo does not go away. Follow these instructions at home: For 24 hours after doing the Epley maneuver: Keep your head in an upright position. When lying down to sleep or rest, keep your head raised (elevated) with two or more pillows. Avoid excessive neck movements. Activity Do not drive or use machinery if you feel dizzy. After doing the Epley maneuver, return to your normal activities as told by your health care provider. Ask your health care provider what activities are safe for you. General instructions Drink enough fluid to keep your urine pale yellow. Do not drink alcohol. Take over-the-counter and prescription medicines only as told by your health care provider. Keep all follow-up visits. This is important. Preventing vertigo symptoms Ask your health care provider if there is anything you should do at home to prevent vertigo. He or she may recommend that you: Keep your head elevated with two or more pillows while you sleep. Do not sleep  on the side of your affected ear. Get up slowly from bed. Avoid sudden movements during the day. Avoid extreme head positions or movement, such as looking up or bending over. Contact a health care provider if: Your vertigo gets worse. You have other symptoms, including: Nausea. Vomiting. Headache. Get help right away if you: Have vision changes. Have a headache or neck pain that is severe or getting worse. Cannot stop vomiting. Have new numbness or weakness in any part of your body. These symptoms may represent a serious problem that is an emergency. Do not wait to see if the symptoms will go away. Get medical help right away. Call your local emergency services (911 in the [place] Do not drive yourself to the hospital. Summary Vertigo is the feeling that you or your surroundings are moving when they are not. The Epley maneuver is an exercise that relieves symptoms of vertigo. If the Epley maneuver  is done correctly, it is considered safe. This information is not intended to replace advice given to you by your health care provider. Make sure you discuss any questions you have with your health care provider. Document Revised: 11/25/2019 Document Reviewed: 11/25/2019 Elsevier Patient Education  [place]. Vertigo Vertigo is the feeling that you or the things around you are moving when they are not. This feeling can come and go at any time. Vertigo often goes away on its own. This condition can be dangerous if it happens when you are doing activities like driving or working with machines. Your doctor will do tests to find the cause of your vertigo. These tests will also help your doctor decide on the best treatment for you. Follow these instructions at home: Eating and drinking     Drink enough fluid to keep your pee (urine) pale yellow. Do not drink alcohol. Activity Return to your normal activities when your doctor says that it is safe. In the morning, first sit up on the  side of the bed. When you feel okay, stand slowly while you hold onto something until you know that your balance is fine. Move slowly. Avoid sudden body or head movements or certain positions, as told by your doctor. Use a cane if you have trouble standing or walking. Sit down right away if you feel dizzy. Avoid doing any tasks or activities that can cause danger to you or others if you get dizzy. Avoid bending down if you feel dizzy. Place items in your home so that they are easy for you to reach without bending or leaning over. Do not drive or use machinery if you feel dizzy. General instructions Take over-the-counter and prescription medicines only as told by your doctor. Keep all follow-up visits. Contact a doctor if: Your medicine does not help your vertigo. Your problems get worse or you have new symptoms. You have a fever. You feel like you may vomit (nauseous), or this feeling gets worse. You start to vomit. Your family or friends see changes in how you act. You lose feeling (have numbness) in part of your body. You feel prickling and tingling in a part of your body. Get help right away if: You are always dizzy. You faint. You get very bad headaches. You get a stiff neck. Bright light starts to bother you. You have trouble moving or talking. You feel weak in your hands, arms, or legs. You have changes in your hearing or in how you see (vision). These symptoms may be an emergency. Get help right away. Call your local emergency services (911 in the [place] Do not wait to see if the symptoms will go away. Do not drive yourself to the hospital. Summary Vertigo is the feeling that you or the things around you are moving when they are not. Your doctor will do tests to find the cause of your vertigo. You may be told to avoid some tasks, positions, or movements. Contact a doctor if your medicine is not helping, or if you have a fever, new symptoms, or a change in how you act. Get  help right away if you get very bad headaches, or if you have changes in how you speak, hear, or see. This information is not intended to replace advice given to you by your health care provider. Make sure you discuss any questions you have with your health care provider. Document Revised: 11/25/2019 Document Reviewed: 11/25/2019 Elsevier Patient Education  [place]. Dizziness Dizziness is  a common problem. It is a feeling of unsteadiness or light-headedness. You may feel like you are about to faint. Dizziness can lead to injury if you stumble or fall. Anyone can become dizzy, but dizziness is more common in older adults. This condition can be caused by a number of things, including medicines, dehydration, or illness. Follow these instructions at home: Eating and drinking  Drink enough fluid to keep your urine pale yellow. This helps to keep you from becoming dehydrated. Try to drink more clear fluids, such as water. Do not drink alcohol. Limit your caffeine intake if told to do so by your health care provider. Check ingredients and nutrition facts to see if a food or beverage contains caffeine. Limit your salt (sodium) intake if told to do so by your health care provider. Check ingredients and nutrition facts to see if a food or beverage contains sodium. Activity  Avoid making quick movements. Rise slowly from chairs and steady yourself until you feel okay. In the morning, first sit up on the side of the bed. When you feel okay, stand slowly while you hold onto something until you know that your balance is good. If you need to stand in one place for a long time, move your legs often. Tighten and relax the muscles in your legs while you are standing. Do not drive or use machinery if you feel dizzy. Avoid bending down if you feel dizzy. Place items in your home so that they are easy for you to reach without leaning over. Lifestyle Do not use any products that contain nicotine or  tobacco. These products include cigarettes, chewing tobacco, and vaping devices, such as e-cigarettes. If you need help quitting, ask your health care provider. Try to reduce your stress level by using methods such as yoga or meditation. Talk with your health care provider if you need help to manage your stress. General instructions Watch your dizziness for any changes. Take over-the-counter and prescription medicines only as told by your health care provider. Talk with your health care provider if you think that your dizziness is caused by a medicine that you are taking. Tell a friend or a family member that you are feeling dizzy. If he or she notices any changes in your behavior, have this person call your health care provider. Keep all follow-up visits. This is important. Contact a health care provider if: Your dizziness does not go away or you have new symptoms. Your dizziness or light-headedness gets worse. You feel nauseous. You have reduced hearing. You have a fever. You have neck pain or a stiff neck. Your dizziness leads to an injury or a fall. Get help right away if: You vomit or have diarrhea and are unable to eat or drink anything. You have problems talking, walking, swallowing, or using your arms, hands, or legs. You feel generally weak. You have any bleeding. You are not thinking clearly or you have trouble forming sentences. It may take a friend or family member to notice this. You have chest pain, abdominal pain, shortness of breath, or sweating. Your vision changes or you develop a severe headache. These symptoms may represent a serious problem that is an emergency. Do not wait to see if the symptoms will go away. Get medical help right away. Call your local emergency services (911 in the [place] Do not drive yourself to the hospital. Summary Dizziness is a feeling of unsteadiness or light-headedness. This condition can be caused by a number of things, including medicines,  dehydration, or illness. Anyone can become dizzy, but dizziness is more common in older adults. Drink enough fluid to keep your urine pale yellow. Do not drink alcohol. Avoid making quick movements if you feel dizzy. Monitor your dizziness for any changes. This information is not intended to replace advice given to you by your health care provider. Make sure you discuss any questions you have with your health care provider. Document Revised: 11/30/2019 Document Reviewed: 11/30/2019 Elsevier Patient Education  [place].

## 2022-03-23 ENCOUNTER — Encounter: Payer: Self-pay | Admitting: Neurology

## 2022-04-14 ENCOUNTER — Ambulatory Visit
Admission: RE | Admit: 2022-04-14 | Discharge: 2022-04-14 | Disposition: A | Discharge status: Home / Self Care | Payer: Medicare Other | Source: Ambulatory Visit | Attending: Neurology | Admitting: Neurology

## 2022-04-14 ENCOUNTER — Other Ambulatory Visit: Payer: Self-pay | Admitting: Neurology

## 2022-04-14 DIAGNOSIS — H814 Vertigo of central origin: Secondary | ICD-10-CM

## 2022-04-14 DIAGNOSIS — H905 Unspecified sensorineural hearing loss: Secondary | ICD-10-CM

## 2022-04-17 ENCOUNTER — Encounter: Payer: Self-pay | Admitting: Neurology

## 2022-05-11 NOTE — Progress Notes (Signed)
 Subjective   Patient ID:  Rebecca Hurley is a 82 y.o. (DOB [birth year]) female Chief complaint: Shortness of breath/fatigue, dizziness, hypertension, tachycardia    HPI: Rebecca Hurley is an 82 year old female last seen by me last October.  She has hypertension and back last July her blood pressure had been low normal and she had been dizzy so I stopped the Micardis .  Shortly thereafter she was getting numerous elevated blood pressure readings and resumed her Micardis .  Her dizziness did not change off or back on the Micardis .  Last fall she had a small bowel obstruction requiring a hospital stay and a laparotomy.  Reportedly she had a junctional tachycardia on arrival to the hospital that resolved after beta-blocker.  When she saw me in October the dizziness was still an issue but it did not seem to be classic orthostatic hypotension (no change on or off Micardis  and no definite exacerbation of symptoms by standing).  She was worried about the fact that her physical therapist told her that her heart rate was a little fast with exertion but she did not really seem to be symptomatic.  Today she comes back for follow-up telling me that she gets out of breath very very easily.  She says simple chores such as taking a shower will make her short of breath.  She cannot do nearly as much as she used to.  She has no associated chest discomfort.  She has no fever or productive cough.  She denies orthopnea, PND, weight gain or ankle edema.  Hypertension: Today's pressure is 137/77.  She seems to tolerate the Micardis  well.  The computer says her dose is 20 mg daily but she says for the last 2 months she has been on 40 mg daily.  She says she is absolutely sure that that is what Aleck Moats, PA and/or Dr. Knox want her to be taking.  Dizziness: She complains of dizziness almost every single day.  She is scared to drive.  The dizziness is worse if she turns her head a certain way.  (It should be noted that she is  extremely hard of hearing and much of the history involved asking a question and then getting her son to interpret the question and then she would answer it.)  She says dizziness does happen when she is standing but it oftentimes occurs sitting as well.  She says back/neck injections made no impact on the dizziness.  She continues to say that her heart beats fast with exertion.  I tried to ask her if she is feeling her heart beating fast or if she has simply noticed that it is fast and she cannot really answer that question.  Lab in January showed a creatinine of 0.89, potassium 4.1 and CBC was normal.   Reviewed and updated this visit by provider: Tobacco  Allergies  Meds  Problems  Med Hx  Surg Hx  Fam Hx        Review of Systems  Constitutional:  Positive for fatigue. Negative for activity change and unexpected weight change.  HENT:  Positive for hearing loss.   Respiratory:  Positive for shortness of breath. Negative for cough.   Cardiovascular:  Positive for palpitations. Negative for chest pain and leg swelling.  Gastrointestinal:  Negative for abdominal pain, diarrhea, nausea and vomiting.  Neurological:  Positive for dizziness. Negative for syncope and light-headedness.     Objective   Vitals:   05/11/22 1409  BP: 137/77  Patient Position: Sitting  Pulse:  81  Weight: 135 lb (61.2 kg)  SpO2: 98%  PainSc: 0-No pain  PainLoc: Chest     Physical Exam Vitals reviewed.  Constitutional:      General: She is not in acute distress.    Appearance: Normal appearance. She is normal weight. She is not ill-appearing.  HENT:     Head: Normocephalic and atraumatic.     Nose: Nose normal.  Eyes:     General: No scleral icterus.    Conjunctiva/sclera: Conjunctivae normal.  Cardiovascular:     Rate and Rhythm: Normal rate and regular rhythm. No extrasystoles are present.    Chest Wall: PMI is not displaced.     Pulses:          Radial pulses are 2+ on the right side  and 2+ on the left side.     Heart sounds: Normal heart sounds, S1 normal and S2 normal. No murmur heard.    No friction rub.     Comments: No JVD Musculoskeletal:     Right lower leg: No edema.     Left lower leg: No edema.  Pulmonary:     Effort: Pulmonary effort is normal. No respiratory distress.     Breath sounds: Examination of the right-lower field reveals rales. Examination of the left-lower field reveals rales. Rales present. No wheezing.  Abdominal:     General: Abdomen is flat. There is no distension.     Palpations: Abdomen is soft. There is no mass.     Tenderness: There is no abdominal tenderness. There is no guarding.  Skin:    General: Skin is warm and dry.     Capillary Refill: Capillary refill takes less than 2 seconds.     Coloration: Skin is not jaundiced or pale.     Findings: No bruising, erythema or rash.  Neurological:     Mental Status: She is alert and oriented to person, place, and time. Mental status is at baseline.  Psychiatric:        Mood and Affect: Mood normal.        Behavior: Behavior normal.        Thought Content: Thought content normal.        Judgment: Judgment normal.        Assessment and Plan  1. SOB (shortness of breath) (Primary) -     ECG 12 lead -     ECG 12 lead (93000); Future -     Long Term Holter (3-7 Days) Interp Only; Future -     Long Term Holter (3-7 Days) Recording Only; Future -     NM Heart Spect Ph Stress (C); Future -     XR Chest Pa And Lateral; Future 2. Essential hypertension -     telmisartan  (MICARDIS ) 40 MG tablet; Take one tablet (40 mg dose) by mouth daily., Starting Fri 05/11/2022, Normal -     ECG 12 lead -     ECG 12 lead (93000); Future 3. Dizziness -     ECG 12 lead -     ECG 12 lead (93000); Future 4. Palpitations -     Long Term Holter (3-7 Days) Interp Only; Future -     Long Term Holter (3-7 Days) Recording Only; Future    Impression: 1.  Dyspnea-etiology is not clear.  She has some crackles  in her lung bases but there is no JVD, hepatomegaly or lower extremity edema. 2.  Hypertension-seemingly stable.  She assures me that Aleck Moats  wants her on Micardis  40 mg daily.  Her blood pressure seems reasonably well-controlled. 3.  Palpitations/tachycardia-she describes tachycardia with minimal exertion. 4.  Dizziness-uncertain etiology.  This does not seem to be orthostatic hypotension.  I suspect this is more of a a problem such as vertigo or a vertigo-like sensation.  Plan: 1.  Chest x-ray 2.  3-day Holter monitor 3.  I recommend a stress test.  I do not think she can safely or adequately exercise on a treadmill so I will pursue a Lexiscan nuclear study 4.  EKG today 5.  Discussed with patient and son.  All questions were answered. Follow up in about 4 months (around 09/11/2022).    I change the order for her Micardis  in the computer to reflect what she is actually taking at home.  Patient's Medications  New Prescriptions   TELMISARTAN  (MICARDIS ) 40 MG TABLET    Take one tablet (40 mg dose) by mouth daily.  Previous Medications   ASPIRIN  (BAYER ASPIRIN  EC LOW DOSE) EC TABLET    Take one tablet (81 mg dose) by mouth daily. Patient has not taken medication   B COMPLEX VITAMINS TABLET    Take one tablet by mouth daily.   CALCIUM-VITAMIN D PO    Take 1 tablet by mouth 2 (two) times daily.   FLAXSEED, LINSEED, 1000 MG CAPS    Take 5 mLs by mouth daily.   GLUCOSAMINE-CHONDROITIN 500-400 MG PER TABLET    Take one tablet by mouth daily.   LORAZEPAM  (ATIVAN ) 0.5 MG TABLET    Take one tablet (0.5 mg dose) by mouth every 8 (eight) hours as needed. Max Daily Amount: 1.5 mg   LUTEIN PO    Take 120 mg by mouth daily.   VITAMIN C (ASCORBIC ACID) 500 MG TABLET    Take one tablet (500 mg dose) by mouth 2 (two) times daily.  Modified Medications   No medications on file  Discontinued Medications   TELMISARTAN  (MICARDIS ) 20 MG TABLET    Take one tablet (20 mg dose) by mouth daily.       Risks, benefits, and alternatives of the medications and treatment plan prescribed today were discussed, and patient expressed understanding. Plan follow-up as discussed or as needed if any worsening symptoms or change in condition.   A yearly preventative health exam was recommended and current age based recommendations were discussed.

## 2022-07-25 ENCOUNTER — Ambulatory Visit: Payer: Medicare Other | Admitting: Pulmonary Disease

## 2022-07-26 ENCOUNTER — Ambulatory Visit: Payer: Medicare Other | Admitting: Pulmonary Disease

## 2022-07-26 ENCOUNTER — Encounter: Payer: Self-pay | Admitting: Pulmonary Disease

## 2022-07-26 VITALS — BP 126/70 | HR 74 | Ht 60.0 in | Wt 138.0 lb

## 2022-07-26 DIAGNOSIS — R0609 Other forms of dyspnea: Secondary | ICD-10-CM | POA: Diagnosis not present

## 2022-07-26 NOTE — Patient Instructions (Signed)
Nice to meet you  I ordered pulmonary function test for further evaluation of your symptoms  Will meet in 1 month to discuss the results  Return to clinic in 1 month after pulmonary function test with Dr. Judeth Horn
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
## 2022-08-02 ENCOUNTER — Ambulatory Visit (HOSPITAL_COMMUNITY)
Admission: RE | Admit: 2022-08-02 | Discharge: 2022-08-02 | Disposition: A | Discharge status: Home / Self Care | Payer: Medicare Other | Source: Ambulatory Visit | Attending: Pulmonary Disease | Admitting: Pulmonary Disease

## 2022-08-02 DIAGNOSIS — R0609 Other forms of dyspnea: Secondary | ICD-10-CM | POA: Insufficient documentation

## 2022-08-02 LAB — PULMONARY FUNCTION TEST
DL/VA % pred: 100 %
DL/VA: 4.2 ml/min/mmHg/L
DLCO unc % pred: 75 %
DLCO unc: 12.25 ml/min/mmHg
FEF 25-75 Pre: 1.25 L/sec
FEF2575-%Pred-Pre: 128 %
FEV1-%Pred-Pre: 94 %
FEV1-Pre: 1.35 L
FEV1FVC-%Pred-Pre: 113 %
FEV6-%Pred-Pre: 89 %
FEV6-Pre: 1.63 L
FEV6FVC-%Pred-Pre: 107 %
FVC-%Pred-Pre: 83 %
FVC-Pre: 1.63 L
Pre FEV1/FVC ratio: 83 %
Pre FEV6/FVC Ratio: 100 %
RV % pred: 90 %
RV: 2.06 L
TLC % pred: 82 %
TLC: 3.68 L

## 2022-08-17 ENCOUNTER — Encounter: Payer: Self-pay | Admitting: Pulmonary Disease

## 2022-08-17 ENCOUNTER — Ambulatory Visit: Payer: Medicare Other | Admitting: Pulmonary Disease

## 2022-08-17 VITALS — BP 126/70 | HR 68 | Temp 98.3°F | Ht 60.0 in | Wt 139.4 lb

## 2022-08-17 DIAGNOSIS — J8410 Pulmonary fibrosis, unspecified: Secondary | ICD-10-CM
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
## 2022-08-17 NOTE — Patient Instructions (Addendum)
Nice to see you again  Pulmonary function tests are normal  Will do a CT scan to follow-up on the scarring on top of the lung in early January 2025  Return to clinic as needed

## 2022-08-23 DIAGNOSIS — L565 Disseminated superficial actinic porokeratosis (DSAP): Secondary | ICD-10-CM | POA: Insufficient documentation
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
## 2022-09-20 ENCOUNTER — Telehealth: Payer: Self-pay | Admitting: Adult Health

## 2022-09-20 ENCOUNTER — Ambulatory Visit: Payer: Medicare Other | Admitting: Adult Health

## 2022-09-20 ENCOUNTER — Encounter: Payer: Self-pay | Admitting: Adult Health

## 2022-09-20 VITALS — BP 151/72 | HR 84 | Ht 60.0 in | Wt 142.0 lb

## 2022-09-20 DIAGNOSIS — M542 Cervicalgia: Secondary | ICD-10-CM | POA: Diagnosis not present

## 2022-09-20 DIAGNOSIS — H814 Vertigo of central origin: Secondary | ICD-10-CM | POA: Diagnosis not present

## 2022-09-20 NOTE — Patient Instructions (Addendum)
Your Plan:  Recommend completed of CTA head/neck to ensure no concerning findings contributing to dizziness - can contact [phone] to schedule  Continue use of lorazepam as needed for dizziness - can try to talk half a pill in the morning and mid afternoon to see if this further helps. Please do not mix lorazepam with wine   Continue to follow with [facility] for neck pain management      Follow up with Dr. Vickey Huger in 4-5 months or call earlier if needed      Thank you for coming to see [place] at [facility]. I hope we have been able to provide you high quality care today.  You may receive a patient satisfaction survey over the next few weeks. We would appreciate your feedback and comments so that we may continue to improve ourselves and the health of our patients.

## 2022-09-20 NOTE — Telephone Encounter (Signed)
[employer] medicare NPR sent to GI [phone] 

## 2022-09-21 NOTE — Progress Notes (Signed)
 NEED CONTROLLED SUBSTANCE AGREEMENT  Subjective:    Rebecca Hurley is a 82 y.o. (DOB [birth year]) female.     Patient presents with  . Follow-up    Pt states that neurology changed Lorazepam  to 1/2 tab bid. She wishes to have refill.  FYI: Pt is hard of hearing.  SABRA Ba    Would like cologuard sent  And to see surgeon revisit since had a history of small bowel obstruction, hernia and lysis of adhesion.    . Neck Pain    Please see note from neurologist! Increasing diazepam  to 1/2 tablet twice a day. Would like to wait to see neurosurgeon.      HPI   PER [facility] Whitfield Raisin, NP - 09/20/2022 1:45 PM EDT  Formatting of this note might be different from the original. Your Plan:  Recommend completed of CTA head/neck to ensure no concerning findings contributing to dizziness - can contact [phone] to schedule  Continue use of lorazepam  as needed for dizziness - can try to talk half a pill in the morning and mid afternoon to see if this further helps. Please do not mix lorazepam  with wine  Continue to follow with [facility] for neck pain management  Follow up with Dr. Chalice in 4-5 months or call earlier if needed  Thank you for coming to see us  at [facility]. I hope we have been able to provide you high quality care today.  You may receive a patient satisfaction survey over the next few weeks. We would appreciate your feedback and comments so that we may continue to improve ourselves and the health of our patients.  Electronically signed by Whitfield Raisin, NP at 09/20/2022 2:26 PM EDT   Reviewed and updated this visit by provider: Tobacco  Allergies  Meds  Problems  Med Hx  Surg Hx  Fam Hx  PDMP       Review of Systems  Constitutional: Negative.        ACCOMPANIED BY SON  HENT:  Positive for hearing loss.        READS LIPS  Musculoskeletal:  Positive for myalgias and neck pain.  All other systems reviewed and  are negative.   Objective:   Vitals:   09/21/22 1346  BP: 136/80  Patient Position: Sitting  Pulse: 68  Temp: 97.8 F (36.6 C)  TempSrc: Temporal  Resp: 14  Height: 5' 2 (1.575 m)  Weight: 143 lb 6.4 oz (65 kg)  SpO2: 97%  BMI (Calculated): 26.2  PainSc:   4  PainLoc: Neck   Physical Exam Vitals and nursing note reviewed.  Constitutional:      General: She is not in acute distress.    Appearance: Normal appearance. She is well-developed and normal weight.  HENT:     Head: Normocephalic.  Cardiovascular:     Rate and Rhythm: Normal rate and regular rhythm.     Pulses: Normal pulses.     Heart sounds: Normal heart sounds.  Musculoskeletal:       Feet:  Pulmonary:     Effort: Pulmonary effort is normal.     Breath sounds: Normal breath sounds.  Feet:     Comments: Healing callus on great toe left  Skin:    General: Skin is warm and dry.     Findings: Rash present.          Comments: On legs and arm left leg, has a healing biopsy area   Neurological:  Mental Status: She is alert.  Psychiatric:        Behavior: Behavior normal.        Thought Content: Thought content normal.        Judgment: Judgment normal.       Assessment / Plan:   Assessment 1. Chronic neck pain (Primary) -     LORAzepam  (ATIVAN ) 0.5 mg tablet; Take one half tablet (0.25 mg dose) by mouth 2 (two) times daily for 30 days. Take 1/2 tablet by mouth daily as needed.Take 1/2 tablet by mouth daily as needed. Max Daily Amount: 0.5 mg, Starting Fri 09/21/2022, Until Sun 10/21/2022, Normal 2. Vertigo of central origin -     LORAzepam  (ATIVAN ) 0.5 mg tablet; Take one half tablet (0.25 mg dose) by mouth 2 (two) times daily for 30 days. Take 1/2 tablet by mouth daily as needed.Take 1/2 tablet by mouth daily as needed. Max Daily Amount: 0.5 mg, Starting Fri 09/21/2022, Until Sun 10/21/2022, Normal 3. Colon cancer screening -     Cologuard Stool 4. History of lysis of adhesions -     Ambulatory  Referral to General Surgery 5. Flu vaccine need -     Flucelvax Trivalent Preservative Free Prefilled Syringe    Plan  Controlled Substance agreement was explained and signed by patient and provider.  Patient understands that breaking this agreement can result in dismissal from practice or discontinuation of medication being prescribed as stated on Controlled Substance contract.  Agreement is scanned into system and FYI updated.  Risks, benefits, and alternatives of the medications and treatment plan prescribed today were discussed, and patient expressed understanding. Plan follow-up as discussed or as needed if any worsening symptoms or change in condition.        Medications reviewed updated in chart, copy given to patient.  No barriers to learning identified.   Previsit planning was completed via snapshot and chart reviewed.  The patient/family voices understanding of all medications. No barriers to adherence. Patient taking all medications as prescribed and tolerating well.  For any new medications the patient/family was instructed of directions for and consequences of not taking medication and they were informed about the potential side effects and drug interactions.  After visit summary given to patient.  Patient verbalized to me that they understood what their problem is, what they need to do about it and why it is important that they do it. Follow up in about 2 months (around 11/21/2022) for medication management and refills.

## 2022-10-12 ENCOUNTER — Ambulatory Visit
Admission: RE | Admit: 2022-10-12 | Discharge: 2022-10-12 | Disposition: A | Discharge status: Home / Self Care | Payer: Medicare Other | Source: Ambulatory Visit | Attending: Adult Health | Admitting: Adult Health

## 2022-10-12 DIAGNOSIS — H814 Vertigo of central origin: Secondary | ICD-10-CM

## 2022-10-12 MED ORDER — IOPAMIDOL (ISOVUE-370) INJECTION 76%
75.0000 mL | Freq: Once | INTRAVENOUS | Status: AC | PRN
  Administered 2022-10-12: 75 mL via INTRAVENOUS

## 2022-10-15 DIAGNOSIS — R42 Dizziness and giddiness: Secondary | ICD-10-CM | POA: Insufficient documentation

## 2022-10-15 DIAGNOSIS — R1033 Periumbilical pain: Secondary | ICD-10-CM | POA: Insufficient documentation

## 2022-10-15 DIAGNOSIS — Z9049 Acquired absence of other specified parts of digestive tract: Secondary | ICD-10-CM | POA: Insufficient documentation

## 2022-10-17 ENCOUNTER — Other Ambulatory Visit: Payer: Self-pay | Admitting: Surgery

## 2022-10-17 DIAGNOSIS — Z9049 Acquired absence of other specified parts of digestive tract: Secondary | ICD-10-CM

## 2022-10-17 DIAGNOSIS — R42 Dizziness and giddiness: Secondary | ICD-10-CM

## 2022-10-17 DIAGNOSIS — R1033 Periumbilical pain: Secondary | ICD-10-CM

## 2022-10-17 DIAGNOSIS — Z8719 Personal history of other diseases of the digestive system: Secondary | ICD-10-CM

## 2022-11-03 ENCOUNTER — Ambulatory Visit (HOSPITAL_BASED_OUTPATIENT_CLINIC_OR_DEPARTMENT_OTHER)
Admission: RE | Admit: 2022-11-03 | Discharge: 2022-11-03 | Disposition: A | Discharge status: Home / Self Care | Payer: Medicare Other | Source: Ambulatory Visit | Attending: Surgery | Admitting: Surgery

## 2022-11-03 DIAGNOSIS — R1033 Periumbilical pain: Secondary | ICD-10-CM | POA: Insufficient documentation

## 2022-11-03 DIAGNOSIS — Z8719 Personal history of other diseases of the digestive system: Secondary | ICD-10-CM | POA: Insufficient documentation

## 2022-11-03 DIAGNOSIS — Z9889 Other specified postprocedural states: Secondary | ICD-10-CM | POA: Diagnosis present

## 2022-11-03 DIAGNOSIS — Z9049 Acquired absence of other specified parts of digestive tract: Secondary | ICD-10-CM | POA: Insufficient documentation

## 2022-11-03 DIAGNOSIS — R42 Dizziness and giddiness: Secondary | ICD-10-CM | POA: Diagnosis present

## 2022-11-03 MED ORDER — IOHEXOL 300 MG/ML  SOLN
100.0000 mL | Freq: Once | INTRAMUSCULAR | Status: AC | PRN
  Administered 2022-11-03: 100 mL via INTRAVENOUS

## 2022-11-05 NOTE — Progress Notes (Signed)
Please contact patient regarding Dr. Oliva Bustard recommendations - she is okay with repeat MRI w/ contrast if patient still interested in completing. She had no specific input regarding ablation and dizziness.  She will need to further discuss this with provider doing procedure.

## 2022-11-06 ENCOUNTER — Ambulatory Visit: Payer: Self-pay | Admitting: Surgery

## 2022-11-06 ENCOUNTER — Telehealth: Payer: Self-pay

## 2022-11-06 DIAGNOSIS — Z889 Allergy status to unspecified drugs, medicaments and biological substances status: Secondary | ICD-10-CM | POA: Insufficient documentation

## 2022-11-06 DIAGNOSIS — K432 Incisional hernia without obstruction or gangrene: Secondary | ICD-10-CM | POA: Insufficient documentation

## 2022-11-06 NOTE — Telephone Encounter (Signed)
Called patient and spoke with her son Rosanne Ashing to inform him the finding on his mother CT of the head and neck. Pt verbalized understanding. Pt had no questions at this time but was encouraged to call back if questions arise.

## 2022-11-06 NOTE — Telephone Encounter (Signed)
-----   Message from [place] sent at 11/05/2022  1:33 PM EDT ----- Please advise patient (she requested to be called with results) that recent CTA head/neck did not show any concerning findings that would be contributing to her dizziness/vertigo (such as posterior circulation concerns).  The scan did mention left ICA possible dissection, reviewed with Dr. Pearlean Brownie who suspected possible arthrosclerosis in that area vs dissection. She is already on aspirin therapy. Avoid neck manipulation activities.  Continue to follow with PCP for vascular risk factor management.  No further recommendations or work up indicated at this time. Thank you.

## 2022-12-11 ENCOUNTER — Telehealth: Payer: Self-pay | Admitting: Adult Health

## 2022-12-11 NOTE — Telephone Encounter (Signed)
Pt's son called requesting the name of the neurosurgeon that Shanda Bumps referred pt to back at her September appt. I did not see anything in the chart suggesting that we sent a referral to a neurosurgeon. He states that they are now wanting to move forward with seeing a neurosurgeon and would like for a referral to be placed. Please advise, I informed Rosanne Ashing that we could call back with information.

## 2022-12-11 NOTE — Telephone Encounter (Signed)
I do not specifically remembering referral to neurosurgery for this patient, she is already being followed by spine specialist for pain management.  Looks like PCP recently placed referral to neurosurgery so would recommend he follow-up with PCP regarding this.  Thank you.

## 2023-01-04 DIAGNOSIS — R42 Dizziness and giddiness: Secondary | ICD-10-CM | POA: Insufficient documentation

## 2023-01-21 ENCOUNTER — Telehealth: Payer: Self-pay | Admitting: Adult Health

## 2023-01-21 NOTE — Telephone Encounter (Signed)
 Pt's son cx appt. Will call back to rs if need be

## 2023-01-22 ENCOUNTER — Ambulatory Visit
Admission: EM | Admit: 2023-01-22 | Discharge: 2023-01-22 | Disposition: A | Discharge status: Home / Self Care | Payer: Medicare Other

## 2023-01-22 DIAGNOSIS — S90862A Insect bite (nonvenomous), left foot, initial encounter: Secondary | ICD-10-CM

## 2023-01-22 DIAGNOSIS — W57XXXA Bitten or stung by nonvenomous insect and other nonvenomous arthropods, initial encounter: Secondary | ICD-10-CM

## 2023-01-22 MED ORDER — TRIAMCINOLONE ACETONIDE 0.5 % EX OINT: 1.0000 | TOPICAL_OINTMENT | Freq: Two times a day (BID) | CUTANEOUS | 0 refills | Status: AC
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
## 2023-01-22 NOTE — Discharge Instructions (Addendum)
 Use ointment as directed.  Follow-up with dermatology if symptoms persist.  Avoid heat hot water as it makes rashes worse.  Return as needed.

## 2023-01-22 NOTE — ED Triage Notes (Signed)
"  I am very allergic to spider bites and I have one on my right wrist (inside)". "A jumping spider had bitten me a few months ago too on my left foot causing a rash". "I need these area's checked". No fever.

## 2023-01-24 ENCOUNTER — Ambulatory Visit: Payer: Medicare Other | Admitting: Neurology

## 2023-01-26 ENCOUNTER — Ambulatory Visit (HOSPITAL_BASED_OUTPATIENT_CLINIC_OR_DEPARTMENT_OTHER): Payer: Medicare Other

## 2023-01-29 ENCOUNTER — Other Ambulatory Visit: Payer: Medicare Other

## 2023-02-11 ENCOUNTER — Ambulatory Visit: Payer: Medicare Other | Admitting: Pulmonary Disease
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
## 2023-09-09 NOTE — Progress Notes (Signed)
 Please contact the patient concerning their result with the following instructions: Your urine culture returned indicating the urinary tract infection has not cleared. I have sent over an antibiotic for you to take twice daily for the next 7 days. Please ensure you complete the course of antibiotic. Thank you.

## 2023-09-23 NOTE — Progress Notes (Signed)
 An After Visit Summary was printed and given to the patient. ERW,CMA
